# Patient Record
Sex: Female | Born: 1975 | Race: White | Hispanic: No | Marital: Single | Smoking: Never smoker
Health system: Southern US, Community
[De-identification: ages and names within clinical notes are randomized; demographics above are authoritative.]

## PROBLEM LIST (undated history)

## (undated) DIAGNOSIS — R040 Epistaxis: Secondary | ICD-10-CM

## (undated) DIAGNOSIS — R002 Palpitations: Secondary | ICD-10-CM

## (undated) DIAGNOSIS — D369 Benign neoplasm, unspecified site: Secondary | ICD-10-CM

## (undated) DIAGNOSIS — I340 Nonrheumatic mitral (valve) insufficiency: Secondary | ICD-10-CM

## (undated) DIAGNOSIS — L253 Unspecified contact dermatitis due to other chemical products: Secondary | ICD-10-CM

## (undated) HISTORY — PX: WISDOM TOOTH EXTRACTION: SHX21

## (undated) HISTORY — DX: Benign neoplasm, unspecified site: D36.9

## (undated) HISTORY — DX: Unspecified contact dermatitis due to other chemical products: L25.3

## (undated) HISTORY — DX: Nonrheumatic mitral (valve) insufficiency: I34.0

## (undated) HISTORY — DX: Palpitations: R00.2

## (undated) HISTORY — DX: Epistaxis: R04.0

## (undated) HISTORY — PX: OTHER SURGICAL HISTORY: SHX169

---

## 2014-06-10 ENCOUNTER — Ambulatory Visit (INDEPENDENT_AMBULATORY_CARE_PROVIDER_SITE_OTHER): Payer: BC Managed Care – PPO

## 2014-06-10 ENCOUNTER — Ambulatory Visit (INDEPENDENT_AMBULATORY_CARE_PROVIDER_SITE_OTHER): Payer: BC Managed Care – PPO | Admitting: Emergency Medicine

## 2014-06-10 VITALS — BP 128/84 | HR 99 | Temp 97.9°F | Resp 16 | Ht 69.0 in | Wt 173.0 lb

## 2014-06-10 DIAGNOSIS — J3089 Other allergic rhinitis: Secondary | ICD-10-CM

## 2014-06-10 DIAGNOSIS — R0989 Other specified symptoms and signs involving the circulatory and respiratory systems: Secondary | ICD-10-CM

## 2014-06-10 DIAGNOSIS — J029 Acute pharyngitis, unspecified: Secondary | ICD-10-CM

## 2014-06-10 DIAGNOSIS — R0981 Nasal congestion: Secondary | ICD-10-CM

## 2014-06-10 DIAGNOSIS — J3489 Other specified disorders of nose and nasal sinuses: Secondary | ICD-10-CM

## 2014-06-10 LAB — POCT RAPID STREP A (OFFICE): RAPID STREP A SCREEN: NEGATIVE

## 2014-06-10 MED ORDER — FLUTICASONE PROPIONATE 50 MCG/ACT NA SUSP: 2.0000 | Freq: Every day | NASAL | Status: DC

## 2014-06-10 MED ORDER — PROPRANOLOL HCL 10 MG PO TABS: ORAL_TABLET | ORAL | Status: DC

## 2014-06-10 MED ORDER — MONTELUKAST SODIUM 10 MG PO TABS: 10.0000 mg | ORAL_TABLET | Freq: Every day | ORAL | Status: DC

## 2014-06-10 NOTE — Patient Instructions (Signed)

## 2014-06-10 NOTE — Progress Notes (Signed)
   Subjective:    Patient ID: Erin Klein, female    DOB: 1976/04/04, 38 y.o.   MRN: [id number]  HPI patient states that 2 weeks ago she was exposed to mold and mildew at work. Her symptoms resolved after a few days. On Monday she moved the equipment in our office. There is a lot of dust and mold over her papers. After this she developed significant nasal congestion a nonproductive cough and puffiness in her sinus area. She has no previous history of severe allergies     Review of Systems     Objective:   Physical Exam patient is alert and cooperative she is not in any distress. Her TMs were clear. Nose shows turbinates to be blue and swollen. Throat is not red. Neck supple without adenopathy. Chest clear to both auscultation and percussion. Heart regular rate no murmur  Peak Flow 400  Results for orders placed in visit on 06/10/14  POCT RAPID STREP A (OFFICE)      Result Value Ref Range   Rapid Strep A Screen Negative  Negative   [facility] reading (PRIMARY) by  Dr. Everlene Farrier  one view chest shows no acute disease     Assessment & Plan:  She will take Zyrtec one a day use Flonase once a day and have the Singulair at night . She can also use Benadryl as needed

## 2015-11-12 ENCOUNTER — Other Ambulatory Visit: Payer: Self-pay | Admitting: Emergency Medicine

## 2015-12-22 ENCOUNTER — Encounter: Payer: Self-pay | Admitting: Cardiology

## 2015-12-22 ENCOUNTER — Ambulatory Visit (INDEPENDENT_AMBULATORY_CARE_PROVIDER_SITE_OTHER): Payer: BC Managed Care – PPO | Admitting: Cardiology

## 2015-12-22 VITALS — BP 130/78 | HR 99 | Ht 69.0 in | Wt 176.0 lb

## 2015-12-22 DIAGNOSIS — I340 Nonrheumatic mitral (valve) insufficiency: Secondary | ICD-10-CM | POA: Diagnosis not present

## 2015-12-22 DIAGNOSIS — R002 Palpitations: Secondary | ICD-10-CM

## 2015-12-22 DIAGNOSIS — Z136 Encounter for screening for cardiovascular disorders: Secondary | ICD-10-CM | POA: Diagnosis not present

## 2015-12-22 NOTE — Progress Notes (Signed)
Cardiology Office Note  Date: 12/22/2015   ID: Erin Klein, DOB Aug 31, 1976, MRN [id number]  PCP: Vevelyn Pat, MD  Consulting Cardiologist: Rozann Lesches, MD   Chief Complaint  Patient presents with  . History of mitral regurgitation    History of Present Illness: Erin Klein is a 40 y.o. female presenting for cardiac evaluation. She reports a history of mitral regurgitation and also palpitations. I reviewed the available records. She previously followed with Dr. Ledora Bottcher with [facility] in [place], [place]. I summarized her cardiac testing below. She does not have mitral valve prolapse, described to have mild thickening of the mitral valve with only mild mitral regurgitation which is unlikely to be symptomatic. Prior cardiac monitoring did demonstrate rare PVCs, but no other arrhythmias. She states that her palpitations are most bothersome related to emotional stress. She works as a Teaching laboratory technician. She has used propranolol for stressful situations with benefit.  ECG today shows sinus rhythm with decreased R wave progression and nonspecific T-wave changes.  Her last echocardiogram was in 2014.  She does not report sudden dizziness or syncope, no exertional chest pain. She has gained about 20-25 pounds over time and has noticed a reduction in her stamina with this. We did talk about diet and exercise today.  Past Medical History  Diagnosis Date  . Mitral regurgitation     Mild  . Palpitations     Rare PVCs by cardiac monitor  . Dermoid cyst     Removed    Past Surgical History  Procedure Laterality Date  . Dermoid cyst resection      Current Outpatient Prescriptions  Medication Sig Dispense Refill  . propranolol (INDERAL) 10 MG tablet Take one pill daily as needed 90 tablet 1   No current facility-administered medications for this visit.   Allergies:  Review of patient's allergies indicates no known allergies.     Social History: The patient  reports that she has never smoked. She does not have any smokeless tobacco history on file. She reports that she drinks alcohol. She reports that she does not use illicit drugs.   Family History: The patient's family history includes Hypertension in her brother, father, and mother.   ROS:  Please see the history of present illness. Otherwise, complete review of systems is positive for typically NYHA class II dyspnea.  All other systems are reviewed and negative.   Physical Exam: VS:  BP 130/78 mmHg  Pulse 99  Ht 5\' 9"  (1.753 m)  Wt 176 lb (79.833 kg)  BMI 25.98 kg/m2  SpO2 97%, BMI Body mass index is 25.98 kg/(m^2).  Wt Readings from Last 3 Encounters:  12/22/15 176 lb (79.833 kg)  06/10/14 173 lb (78.472 kg)    General: Tall statured woman, appears comfortable at rest. HEENT: Conjunctiva and lids normal, oropharynx clear. Neck: Supple, no elevated JVP or carotid bruits, no thyromegaly. Lungs: Clear to auscultation, nonlabored breathing at rest. Cardiac: Regular rate and rhythm, no S3, faint apical systolic murmur, no click, no pericardial rub. Abdomen: Soft, nontender, bowel sounds present, no guarding or rebound. Extremities: No pitting edema, distal pulses 2+. Skin: Warm and dry. Musculoskeletal: No kyphosis. Neuropsychiatric: Alert and oriented x3, affect grossly appropriate.  ECG: I personally reviewed the prior tracing from 04/08/2015 which showed normal sinus rhythm with decreased R wave progression.  Recent Labwork: May 2016: TSH 3.68 June 2016: Hemoglobin 13.7, platelets 372, BUN 14, creatinine 0.8, potassium 4.3, AST 15, ALT 8, cholesterol 212,  triglycerides 76, HDL 66, LDL 131, TSH 6.61  Other Studies Reviewed Today:  GXT 08/12/2009 ([place], [place]): No chest pain reported, 12.8 METs achieved, 85% MPHR, no diagnostic ST segment abnormalities, normal blood pressure response.  Echocardiogram 04/10/2013 [place], [place] Normal LV EF (74%), normal left atrial size, mildly thickened mitral leaflets with mild mitral regurgitation.  Holter monitor 08/05/2009: Sinus rhythm with PVCs noted, less than 1% total beats, no sustained arrhythmias.  Assessment and Plan:  1. History of mild mitral regurgitation with mild valve thickening but no definite prolapse by prior echocardiography. Her last study was in 2014. She has a faint apical systolic murmur on examination but no click. We will plan a follow-up echocardiogram to reestablish baseline and determine surveillance from there.  2. History of palpitations, mostly associated with emotional stress and anxiety. She uses propranolol intermittently. No definite arrhythmias documented other than rare PVCs by prior cardiac monitoring.  Current medicines were reviewed with the patient today.   Orders Placed This Encounter  Procedures  . EKG 12-Lead  . Echocardiogram    Disposition: FU with me in 1 year.   Signed, Satira Sark, MD, [facility] 12/22/2015 2:41 PM    [place] [facility] [address], [place], [place] [address] Phone: [phone]; Fax: [phone]

## 2015-12-22 NOTE — Patient Instructions (Addendum)
Medication Instructions:  Your physician recommends that you continue on your current medications as directed. Please refer to the Current Medication list given to you today.   Labwork: none  Testing/Procedures: Your physician has requested that you have an echocardiogram. Echocardiography is a painless test that uses sound waves to create images of your heart. It provides your doctor with information about the size and shape of your heart and how well your heart's chambers and valves are working. This procedure takes approximately one hour. There are no restrictions for this procedure.    Follow-Up: Your physician wants you to follow-up in: 1 year.  You will receive a reminder letter in the mail two months in advance. If you don't receive a letter, please call our office to schedule the follow-up appointment.   Any Other Special Instructions Will Be Listed Below (If Applicable).  Thanks for choosing [facility]!!!     If you need a refill on your cardiac medications before your next appointment, please call your pharmacy.

## 2015-12-24 ENCOUNTER — Ambulatory Visit (HOSPITAL_COMMUNITY)
Admission: RE | Admit: 2015-12-24 | Discharge: 2015-12-24 | Disposition: A | Discharge status: Home / Self Care | Payer: BC Managed Care – PPO | Source: Ambulatory Visit | Attending: Cardiology | Admitting: Cardiology

## 2015-12-24 DIAGNOSIS — I071 Rheumatic tricuspid insufficiency: Secondary | ICD-10-CM | POA: Insufficient documentation

## 2015-12-24 DIAGNOSIS — I340 Nonrheumatic mitral (valve) insufficiency: Secondary | ICD-10-CM

## 2016-08-02 ENCOUNTER — Telehealth: Payer: Self-pay | Admitting: Cardiology

## 2016-08-02 MED ORDER — PROPRANOLOL HCL 10 MG PO TABS: ORAL_TABLET | ORAL | 1 refills | Status: DC

## 2016-08-02 NOTE — Telephone Encounter (Deleted)
Pt needs Rx to go to [facility] on [address].

## 2016-08-02 NOTE — Telephone Encounter (Addendum)
1. Which medications need to be refilled? (please list name of each medication and dose if known)   propranolol (INDERAL) 10 MG tablet [id number]    2. Which pharmacy/location (including street and city if local pharmacy) is medication to be sent to?  [place]   3. Do they need a 30 day or 90 day supply?

## 2016-12-19 ENCOUNTER — Telehealth: Payer: Self-pay | Admitting: Cardiology

## 2016-12-19 NOTE — Telephone Encounter (Signed)
That is OK

## 2016-12-19 NOTE — Telephone Encounter (Signed)
New Message    Pt is requesting a switch in cardiologist from [place] to  Dr Dorris Carnes.

## 2016-12-19 NOTE — Telephone Encounter (Signed)
It is okay with me if she would like to make that switch. I saw her one year ago.

## 2016-12-21 NOTE — Progress Notes (Signed)
Cardiology Office Note   Date:  12/22/2016   ID:  Erin Klein, DOB Dec 29, 1975, MRN [id number]  PCP:  Vevelyn Pat, MD  Cardiologist:   Dorris Carnes, MD    Pt presents for continued care of mitral regurg and palpitations    History of Present Illness: Erin Klein is a 41 y.o. female with a history of MR and palpitations  Previously followed by [facility] (seen once in March 2017)  PRior to that seen in [facility] Does not have MVP on echo  Did have PVCs  Has Rx with propranolol   Trivial MR on echo in march Past couple months has had some chest pressure  Laying or sitting  Before bed  Will get reflux  Buring  Not with activity     Just got a boflex trainer   Takes Inderal when faced to increased stress     No outpatient prescriptions have been marked as taking for the 12/22/16 encounter (Office Visit) with Erin Records, MD.     Allergies:   Patient has no known allergies.   Past Medical History:  Diagnosis Date  . Dermoid cyst    Removed  . Mitral regurgitation    Mild  . Palpitations    Rare PVCs by cardiac monitor    Past Surgical History:  Procedure Laterality Date  . Dermoid cyst resection       Social History:  The patient  reports that she has never smoked. She has never used smokeless tobacco. She reports that she drinks alcohol. She reports that she does not use drugs.   Family History:  The patient's family history includes Hypertension in her brother, father, and mother.    ROS:  Please see the history of present illness. All other systems are reviewed and  Negative to the above problem except as noted.    PHYSICAL EXAM: VS:  BP 122/84   Pulse 89   Ht 5\' 9"  (1.753 m)   Wt 187 lb (84.8 kg)   BMI 27.62 kg/m   GEN: Well nourished, well developed, in no acute distress  HEENT: normal  Neck: no JVD, carotid bruits, or masses Cardiac: RRR; no murmurs, rubs, or gallops,no edema  Respiratory:  clear to auscultation bilaterally, normal  work of breathing GI: soft, nontender, nondistended, + BS  No hepatomegaly  MS: no deformity Moving all extremities   Skin: warm and dry, no rash Neuro:  Strength and sensation are intact Psych: euthymic mood, full affect   EKG:  EKG is ordered today.SR 89 bpm     Lipid Panel No results found for: CHOL, TRIG, HDL, CHOLHDL, VLDL, LDLCALC, LDLDIRECT    Wt Readings from Last 3 Encounters:  12/22/16 187 lb (84.8 kg)  12/22/15 176 lb (79.8 kg)  06/10/14 173 lb (78.5 kg)      ASSESSMENT AND PLAN:  1  Palpitations  Denies   Keep on INderal  2  Mitral regurg  Trivial on echo  No Murmur on exam  3  Chest pressure/discomfort  Sounds more GI  Not cardiac  4  HCM  Pt will have lipids sent  Discussed diet and exercise  f/U in 1 year      Current medicines are reviewed at length with the patient today.  The patient does not have concerns regarding medicines.  Signed, Dorris Carnes, MD  12/22/2016 8:51 AM    [address] [place] [place], [place], [place]  [address] Phone: [phone]; Fax: [phone]    

## 2016-12-22 ENCOUNTER — Ambulatory Visit (INDEPENDENT_AMBULATORY_CARE_PROVIDER_SITE_OTHER): Payer: BC Managed Care – PPO | Admitting: Internal Medicine

## 2016-12-22 ENCOUNTER — Encounter: Payer: Self-pay | Admitting: Internal Medicine

## 2016-12-22 ENCOUNTER — Encounter (INDEPENDENT_AMBULATORY_CARE_PROVIDER_SITE_OTHER): Payer: Self-pay

## 2016-12-22 VITALS — BP 122/84 | HR 89 | Ht 69.0 in | Wt 187.0 lb

## 2016-12-22 DIAGNOSIS — R002 Palpitations: Secondary | ICD-10-CM

## 2016-12-22 NOTE — Patient Instructions (Signed)
Your physician wants you to follow-up in: [place] will receive a reminder letter in the mail two months in advance. If you don't receive a letter, please call our office to schedule the follow-up appointment.   If you need a refill on your cardiac medications before your next appointment, please call your pharmacy.

## 2017-07-06 ENCOUNTER — Ambulatory Visit: Payer: BC Managed Care – PPO | Admitting: Allergy

## 2017-07-13 ENCOUNTER — Encounter: Payer: Self-pay | Admitting: Allergy

## 2017-07-13 ENCOUNTER — Ambulatory Visit (INDEPENDENT_AMBULATORY_CARE_PROVIDER_SITE_OTHER): Payer: BC Managed Care – PPO | Admitting: Allergy

## 2017-07-13 VITALS — BP 118/78 | HR 81 | Temp 98.0°F | Resp 18 | Ht 68.0 in | Wt 186.0 lb

## 2017-07-13 DIAGNOSIS — R040 Epistaxis: Secondary | ICD-10-CM

## 2017-07-13 DIAGNOSIS — L500 Allergic urticaria: Secondary | ICD-10-CM | POA: Diagnosis not present

## 2017-07-13 DIAGNOSIS — J301 Allergic rhinitis due to pollen: Secondary | ICD-10-CM

## 2017-07-13 NOTE — Patient Instructions (Addendum)
Hives    - your rash appears consistent with hives and there are no concerning features at this time.  Hives can be caused by variety of different triggers including foods, medications, stings, topical products, illnesses, stress, pressure, temperature changes, vibrations and  exercise to name a few triggers.      - skin testing for environmental allergens and foods today are positive to grasses, weeds, trees, molds, dust mites, cat and cockroach.  Allergen avoidance measures discussed and handouts provided.  Foods were all negative with equivocal result to rye.     - recommend use of Zyrtec 10mg  daily.  May increase to twice a day dosing if hives persists.  Also recommend use of Zantac 150mg  or Pepcid 20mg  in addition to zyrtec if hives are not well managed with Zyrtec alone.    - let us know if hives start to leave any marks/bruising, you have fevers with hives, you have joint aches/pains with hives   - you may have a component of contact dermatitis as well which can be addressed by performing patch testing and also recommend bringing samples of the topical creams you have used in past.     Allergic rhinitis and nosebleeds   - testing as above   - Zyrtec as above   - nasal saline spray to help decrease nosebleeds and moisturize nose  Food avoidance   - will obtain tree nut panel, fish and shellfish panel as well as rye.  This will complete food testing and determine if you can introduce these foods into the diet.   Follow-up 4 months or sooner if needed.  Schedule for patch testing on a Monday.

## 2017-07-13 NOTE — Progress Notes (Signed)
New Patient Note  RE: Erin Klein MRN: [id number] DOB: Dec 05, 1975 Date of Office Visit: 07/13/2017  Referring provider: Hulan Amato* Primary care provider: Sheets, Marella Bile, MD    Chief Complaint: allergic reaction  History of present illness: Erin Klein is a 41 y.o. female presenting today for consultation for allergic reaction.   She rpeorts having an reaction after lunch at a [employer] in June.  She had been to this restaurant before without issue but states she did eat a new dish that had gaucomole but she does not remember what she ate except that the dish was spicy.  Within minutes after finishing lunch she states a welt-like itchy rash developed across her chin and jaw with swelling.   She took benadryl that did help.  But states that the rash was present throughout that next week. Since this she has noticed outbreaks of hives on her face and upper chest occurring weekly.   She reports rash comes and goes in 24hr but can last for days at a time.  The hives do not leave any marks/bruising, no fevers, no joint aches/pain.  She denies any preceding illnesses, no new medications, no stings.  She reports use non dye detergent and soaps.  She uses no make-up.  She does recall using a topical cream containing sulfur for acne management the week she developed the hives.  She got this cream off [place] and threw the tube away.    Around July she started getting nose bleeds that was occurring almost weekly.  The nose bleeds are light and usually stop within minutes.  She went to UC due to multiple bloody nose in one day as well as feeling dizzy.   She was told that it could be related to allergies or sinus infection.   She was given prescription for an antibiotic.    She does report a significant nut and seafood allergy in sister.  Several nephews with peanut allergy.   She had been avoiding tree nuts and shellfish for many years due to sister and would like to  be able to eat them.  However she is nervous she might also be allergic but has never had any testing.  Strawberry and tomato makes her tongue feel tingly.   No history asthma or eczema.    Review of systems: Review of Systems  Constitutional: Negative for chills, fever and malaise/fatigue.  HENT: Positive for nosebleeds. Negative for congestion, ear discharge, ear pain, sinus pain, sore throat and tinnitus.   Eyes: Negative for discharge and redness.  Respiratory: Negative for cough, sputum production, shortness of breath and wheezing.   Cardiovascular: Negative for chest pain.  Gastrointestinal: Negative for abdominal pain, constipation, diarrhea, heartburn, nausea and vomiting.  Musculoskeletal: Negative for joint pain.  Skin: Positive for itching and rash.  Neurological: Negative for headaches.    All other systems negative unless noted above in HPI  Past medical history: Past Medical History:  Diagnosis Date  . Dermoid cyst    Removed  . Mitral regurgitation    Mild  . Palpitations    Rare PVCs by cardiac monitor    Past surgical history: Past Surgical History:  Procedure Laterality Date  . Dermoid cyst resection    . WISDOM TOOTH EXTRACTION      Family history:  Family History  Problem Relation Age of Onset  . Hypertension Mother   . Hypertension Father   . Hypertension Brother   see HPI   Social history:  Lives in a home with carpeting with central cooling.  There are 3 dogs in the home.  There is concern for water damage or mildew.  No concern for roaches in the home.  She is a Teaching laboratory technician.  She has no smoking history.    Medication List: Allergies as of 07/13/2017   No Known Allergies     Medication List       Accurate as of 07/13/17  5:43 PM. Always use your most recent med list.          escitalopram 10 MG tablet Commonly known as:  LEXAPRO TK 1 T PO QD   propranolol 10 MG tablet Commonly known as:  INDERAL Take one pill daily as  needed       Known medication allergies: No Known Allergies   Physical examination: Blood pressure 118/78, pulse 81, temperature 98 F (36.7 C), resp. rate 18, height 5\' 8"  (1.727 m), weight 186 lb (84.4 kg), SpO2 98 %.  General: Alert, interactive, in no acute distress. HEENT: PERRLA, TMs pearly gray, turbinates non-edematous without discharge, post-pharynx non erythematous. Neck: Supple without lymphadenopathy. Lungs: Clear to auscultation without wheezing, rhonchi or rales. {no increased work of breathing. CV: Normal S1, S2 without murmurs. Abdomen: Nondistended, nontender. Skin: mild erythema with telangectasia of cheeks b/l.  fleeting erythematous patches over upper chest and neck. Extremities:  No clubbing, cyanosis or edema. Neuro:   Grossly intact.  Diagnositics/Labs:  Allergy testing: environmental allergy skin prick testing is positive for grasses, weeds, trees, molds, dust mites, cat and cockroach Select food skin prick testing is equivocal to rye.  Negative to peanut, wheat, milk, shellfish mix, fish mix, cashew, tomato, pork, [place], beef, chicken, banana, strawberry, flounder, trout, shrimp, crab, tuna, pecan, walnut, cucumber, oyster, lobster, scallops, salmon, almond, hazelnut, [place] nut, oat Allergy testing results were read and interpreted by provider, documented by clinical staff.   Assessment and plan:   Hives, urticaria (chronic)    - your rash appears consistent with hives and there are no concerning features at this time.  Hives can be caused by variety of different triggers including foods, medications, stings, topical products, illnesses, stress, pressure, temperature changes, vibrations and  exercise to name a few triggers.  It is less likely related to the [place] meal she ate as she has continued to have issues with hives since that time.      - skin testing for environmental allergens and foods today are positive to grasses, weeds, trees, molds, dust  mites, cat and cockroach.  Allergen avoidance measures discussed and handouts provided.  Foods were all negative with equivocal result to rye.     - recommend use of Zyrtec 10mg  daily.  May increase to twice a day dosing if hives persists.  Also recommend use of Zantac 150mg  or Pepcid 20mg  in addition to zyrtec if hives are not well managed with Zyrtec alone.    - let us know if hives start to leave any marks/bruising, you have fevers with hives, you have joint aches/pains with hives   - you may have a component of contact dermatitis as well which can be addressed by performing patch testing and also recommend bringing samples of the topical creams you have used in past for use in patch testing wells.  Allergic rhinitis and nosebleeds   - testing as above   - Zyrtec as above   - nasal saline spray to help decrease nosebleeds and moisturize nose  Food avoidance   - will  obtain tree nut panel, fish and shellfish panel as well as rye.  This will complete food testing and determine if you can introduce these foods into the diet or if in-office challenge would be recommended.   Follow-up 4 months or sooner if needed.  Schedule for patch testing on a Monday.    I appreciate the opportunity to take part in [facility] care. Please do not hesitate to contact me with questions.  Sincerely,   Prudy Feeler, MD Allergy/Immunology [facility]

## 2017-07-18 LAB — ALLERGEN PROFILE, FOOD-FISH
Allergen Walley Pike IgE: <0.1 kU/L
Codfish IgE: <0.1 kU/L
Tuna: <0.1 kU/L

## 2017-07-18 LAB — ALLERGENS(7)
Brazil Nut IgE: 0.1 kU/L — AB
F020-IgE Almond: 0.11 kU/L — AB
F202-IgE Cashew Nut: <0.1 kU/L
Hazelnut (Filbert) IgE: 0.15 kU/L — AB
PEANUT IGE: 0.22 kU/L — AB
Walnut IgE: 0.27 kU/L — AB

## 2017-07-18 LAB — ALLERGEN PROFILE, SHELLFISH
Clam IgE: <0.1 kU/L
F023-IgE Crab: <0.1 kU/L
F080-IgE Lobster: <0.1 kU/L

## 2017-07-18 LAB — ALLERGEN, RYE, F5: Rye IgE: 0.33 kU/L — AB

## 2017-09-10 ENCOUNTER — Encounter: Payer: Self-pay | Admitting: Allergy & Immunology

## 2017-09-10 ENCOUNTER — Encounter: Payer: BC Managed Care – PPO | Admitting: Allergy & Immunology

## 2017-09-10 ENCOUNTER — Ambulatory Visit: Payer: BC Managed Care – PPO | Admitting: Allergy & Immunology

## 2017-09-10 VITALS — BP 116/78 | HR 80 | Ht 69.75 in | Wt 190.0 lb

## 2017-09-10 DIAGNOSIS — L253 Unspecified contact dermatitis due to other chemical products: Secondary | ICD-10-CM

## 2017-09-10 DIAGNOSIS — L500 Allergic urticaria: Secondary | ICD-10-CM

## 2017-09-10 NOTE — Patient Instructions (Addendum)
1. Allergic urticaria versus contact dermatitis - Patches placed today. - Come back on Wednesday and Friday for readings.  - Try using nasal saline gel 1-2 times daily to see if this helps the nose bleeds.   2. Return in about 2 days (around 09/12/2017).  Please inform us of any Emergency Department visits, hospitalizations, or changes in symptoms. Call us before going to the ED for breathing or allergy symptoms since we might be able to fit you in for a sick visit. Feel free to contact us anytime with any questions, problems, or concerns.  It was a pleasure to meet you today! Enjoy the holiday season!  Websites that have reliable patient information: 1. American Academy of Asthma, Allergy, and Immunology: www.aaaai.org 2. Food Allergy Research and Education (FARE): foodallergy.org 3. Mothers of Asthmatics: http://www.asthmacommunitynetwork.org 4. American College of Allergy, Asthma, and Immunology: www.acaai.org

## 2017-09-10 NOTE — Progress Notes (Signed)
    Follow-up Note  RE: Erin Klein MRN: [id number] DOB: [birth year] Date of Office Visit: 09/10/2017  Primary care provider: Vevelyn Pat, MD Referring provider: Hulan Amato*   [name] comes to the office today for the initial patch test interpretation, given suspected history of contact dermatitis. She did bring in some lotions and make-ups. She did not bring in any shampoo at all. She does report continued epistaxis today. She has been on the Flonase with some improvement in rhinitis symptoms, but no change in the epistaxis symptoms. These do not require ED visits or any intervention. She does not use nasal saline rinses routinely.    Diagnostics: patches placed today   Plan:   Allergic contact dermatitis - True Test patches placed. - We also placed samples of topical cosmetics in empty chambers.  - She will come back on Wednesday and Friday for reads.    Salvatore Marvel, MD [facility]

## 2017-09-12 ENCOUNTER — Encounter: Payer: Self-pay | Admitting: Family Medicine

## 2017-09-12 ENCOUNTER — Ambulatory Visit: Payer: BC Managed Care – PPO | Admitting: Family Medicine

## 2017-09-12 DIAGNOSIS — L253 Unspecified contact dermatitis due to other chemical products: Secondary | ICD-10-CM | POA: Insufficient documentation

## 2017-09-12 DIAGNOSIS — R040 Epistaxis: Secondary | ICD-10-CM

## 2017-09-12 HISTORY — DX: Unspecified contact dermatitis due to other chemical products: L25.3

## 2017-09-12 HISTORY — DX: Epistaxis: R04.0

## 2017-09-12 NOTE — Progress Notes (Signed)
   [facility] [id number] Dept: [phone]  FAMILY NURSE PRACTITIONER FOLLOW UP NOTE  RE: Erin Klein MRN: [id number] DOB: 08/01/1976 Date of Office Visit: 09/12/2017  Primary care provider: Vevelyn Pat, MD Referring provider: Hulan Amato*   Erin Klein returns to the office today for the final patch test interpretation, given suspected history of contact dermatitis.    Diagnostics:   TRUE TEST 48-hour hour reading: probable reaction to #4 (Potassium dichromate), possible reaction to #27 (Tixocortol-21-pivalate) and possible reaction to #28 (Gold sodium thiosulfate), possible reaction to #6 on the patch containing products from home (acnomel).  Plan:   Allergic contact dermatitis - The patient has been provided detailed information regarding the substances she is sensitive to, as well as products containing the substances.   - Meticulous avoidance of these substances is recommended.  - If avoidance is not possible, the use of barrier creams or lotions is recommended. - If symptoms persist or progress despite meticulous avoidance of potassium dichromate, Tixocortol 21-pivilate, gold sodium thiosulfate, and acnomel, Dermatology Referral may be warranted.   Drug Allergies:  No Known Allergies    Assessment and Plan: 1. Contact dermatitis due to chemicals   2. Epistaxis     Patient Instructions  Contact dermatitis Your 48 hour patch reading showed a probable reaction to #4 (Potassium dichromate), possible reaction to #27 (Tixocortol-21-pivalate) and possible reaction to #28 (Gold sodium thiosulfate), possible reaction to #6 on the patch containing products from home (acnomel).-   You have been provided detailed information regarding the substances you may be sensitive to, as well as products containing the substances.   - Meticulous avoidance of these substances is recommended.  - If avoidance is not possible, the use of barrier  creams or lotions is recommended. - If symptoms persist or progress despite meticulous avoidance of potassium dichromate, Tixocortol 21-pivilate, gold sodium thiosulfate, and acnomel, Dermatology Referral may be warranted.   Return in about 2 days (around 09/14/2017), or if symptoms worsen or fail to improve, for Final patch reading.    Thank you for the opportunity to care for this patient.  Please do not hesitate to contact me with questions.  Gareth Morgan, FNP [facility] [place] [facility]  I have provided oversight concerning Gareth Morgan' evaluation and treatment of this patient's health issues addressed during today's encounter. I agree with the assessment and therapeutic plan as outlined in the note.   Signed,   Jiles Prows, MD,  Allergy and Immunology,  [facility].

## 2017-09-12 NOTE — Patient Instructions (Addendum)
Contact dermatitis Your 48 hour patch reading showed a probable reaction to #4 (Potassium dichromate), possible reaction to #27 (Tixocortol-21-pivalate) and possible reaction to #28 (Gold sodium thiosulfate), possible reaction to #6 on the patch containing products from home (acnomel).-   You have been provided detailed information regarding the substances you may be sensitive to, as well as products containing the substances.   - Meticulous avoidance of these substances is recommended.  - If avoidance is not possible, the use of barrier creams or lotions is recommended. - If symptoms persist or progress despite meticulous avoidance of potassium dichromate, Tixocortol 21-pivilate, gold sodium thiosulfate, and acnomel, Dermatology Referral may be warranted.

## 2017-09-14 ENCOUNTER — Ambulatory Visit (INDEPENDENT_AMBULATORY_CARE_PROVIDER_SITE_OTHER): Payer: BC Managed Care – PPO | Admitting: Allergy

## 2017-09-14 DIAGNOSIS — L253 Unspecified contact dermatitis due to other chemical products: Secondary | ICD-10-CM

## 2017-09-14 NOTE — Progress Notes (Signed)
    Follow-up Note  RE: Erin Klein MRN: [id number] DOB: [birth year] Date of Office Visit: 09/14/2017  Primary care provider: Vevelyn Pat, MD Referring provider: Hulan Amato*   [name] returns to the office today for the final patch test interpretation, given suspected history of contact dermatitis.    Diagnostics:  TRUE TEST 96 hour reading:  #4 (Potassium dichromate) with erythematous papules.  #11 (ethylenediamine dihydrochloride), #12 (cobalt dichloride), #27  (Tixocortol-21-pivalate) and #28(Gold sodium thiosulfate) all with faint erythema.  #6 Acnomel from home products with faint erythema  48-hour hour reading: probable reaction to #4 (Potassium dichromate), possible reaction to #27 (Tixocortol-21-pivalate) and possible reaction to #28 (Gold sodium thiosulfate), possible reaction to #6 on the patch containing products from home (acnomel).  Plan:  Allergic contact dermatitis  The patient has been provided detailed information regarding the substances she is sensitive to, as well as products containing the substances.  Meticulous avoidance of these substances is recommended. If avoidance is not possible, the use of barrier creams or lotions is recommended. If symptoms persist or progress despite meticulous avoidance of above products, dermatology evaluation may be warranted.

## 2017-10-11 ENCOUNTER — Telehealth: Payer: Self-pay | Admitting: Family Medicine

## 2017-10-11 NOTE — Telephone Encounter (Signed)
I left a message for her to call me with any further questions  regarding the area that remains darkened on her skin after patch testing.

## 2017-10-18 ENCOUNTER — Telehealth: Payer: Self-pay | Admitting: Family Medicine

## 2017-10-18 NOTE — Telephone Encounter (Signed)
Thank you. I called her back and she is going to try a dye-free skin cream until Wednesday. If no better she will come in on Thursday.

## 2017-10-18 NOTE — Telephone Encounter (Signed)
Erin Klein,  Erin Klein would like you to call her regarding a patch test.  She called [facility] office and was transferred here since you were working here today.  Thank you Andee Poles

## 2017-11-05 ENCOUNTER — Ambulatory Visit (INDEPENDENT_AMBULATORY_CARE_PROVIDER_SITE_OTHER): Payer: BC Managed Care – PPO | Admitting: Otolaryngology

## 2017-11-05 DIAGNOSIS — R040 Epistaxis: Secondary | ICD-10-CM

## 2018-01-21 ENCOUNTER — Other Ambulatory Visit: Payer: Self-pay | Admitting: *Deleted

## 2018-01-21 MED ORDER — PROPRANOLOL HCL 10 MG PO TABS: ORAL_TABLET | ORAL | 0 refills | Status: DC

## 2018-02-04 ENCOUNTER — Ambulatory Visit (INDEPENDENT_AMBULATORY_CARE_PROVIDER_SITE_OTHER): Payer: BC Managed Care – PPO | Admitting: Otolaryngology

## 2018-02-04 DIAGNOSIS — R040 Epistaxis: Secondary | ICD-10-CM | POA: Diagnosis not present

## 2018-05-13 ENCOUNTER — Ambulatory Visit (INDEPENDENT_AMBULATORY_CARE_PROVIDER_SITE_OTHER): Payer: BC Managed Care – PPO | Admitting: Internal Medicine

## 2018-05-13 ENCOUNTER — Encounter: Payer: Self-pay | Admitting: Internal Medicine

## 2018-05-13 VITALS — BP 130/70 | HR 89 | Ht 69.5 in | Wt 187.0 lb

## 2018-05-13 DIAGNOSIS — R040 Epistaxis: Secondary | ICD-10-CM

## 2018-05-13 DIAGNOSIS — I340 Nonrheumatic mitral (valve) insufficiency: Secondary | ICD-10-CM

## 2018-05-13 DIAGNOSIS — R0789 Other chest pain: Secondary | ICD-10-CM

## 2018-05-13 DIAGNOSIS — R002 Palpitations: Secondary | ICD-10-CM | POA: Diagnosis not present

## 2018-05-13 NOTE — Progress Notes (Signed)
Cardiology Office Note   Date:  05/13/2018   ID:  Erin Klein, DOB 12-28-1975, MRN [id number]  PCP:  Charisse March, MD  Cardiologist:   Dorris Carnes, MD    Pt presents for continued care of mitral regurg and palpitations    History of Present Illness: Erin Klein is a 42 y.o. female with a history of mitral regurg and palpitations  Previously followed by Myles Gip (seen once in March 2017)  PRior to that seen in [facility] Does not have MVP on echo  Did have PVCs  Has Rx with propranolol   Trivial MR on echo in march I last saw her in clinic in 2018 Doing good overall   Has had some nose bleeds since last summer   Has seen an allergist and and ENT   Palpitaions come and go  Rare   Not significant     Starting work again   [employer]   No outpatient medications have been marked as taking for the 05/13/18 encounter (Office Visit) with Fay Records, MD.     Allergies:   Patient has no known allergies.   Past Medical History:  Diagnosis Date  . Contact dermatitis due to chemicals 09/12/2017  . Dermoid cyst    Removed  . Epistaxis 09/12/2017  . Mitral regurgitation    Mild  . Palpitations    Rare PVCs by cardiac monitor    Past Surgical History:  Procedure Laterality Date  . Dermoid cyst resection    . WISDOM TOOTH EXTRACTION       Social History:  The patient  reports that she has never smoked. She has never used smokeless tobacco. She reports that she drinks alcohol. She reports that she does not use drugs.   Family History:  The patient's family history includes Hypertension in her brother, father, and mother.    ROS:  Please see the history of present illness. All other systems are reviewed and  Negative to the above problem except as noted.    PHYSICAL EXAM: VS:  BP 130/70   Pulse 89   Ht 5' 9.5" (1.765 m)   Wt 187 lb (84.8 kg)   BMI 27.22 kg/m   GEN: Well nourished, well developed, in no acute distress  HEENT: normal  Neck: JVP  is normal  No carotid bruits, or masses Cardiac: RRR; no murmurs, rubs, or gallops,no edema  Respiratory:  clear to auscultation bilaterally, normal work of breathing GI: soft, nontender, nondistended, + BS  No hepatomegaly  MS: no deformity Moving all extremities   Skin: warm and dry, no rash Neuro:  Strength and sensation are intact Psych: euthymic mood, full affect   EKG:  EKG is ordered today.SR 89 BPM       Lipid Panel No results found for: CHOL, TRIG, HDL, CHOLHDL, VLDL, LDLCALC, LDLDIRECT    Wt Readings from Last 3 Encounters:  05/13/18 187 lb (84.8 kg)  09/10/17 190 lb (86.2 kg)  07/13/17 186 lb (84.4 kg)      ASSESSMENT AND PLAN:  1  Palpitations  Denies   2  Mitral regurg  Trivial on echo  No Murmur  3  Hx of Chest pressure/discomfort  Denies    4  Nose bleeds   Pt has been seen by allergy and ENT   I recomm Milta Deiters Med wash   F/U with ENT if still bleeding     f/U in 1 year  Or prn    Current medicines are  reviewed at length with the patient today.  The patient does not have concerns regarding medicines.  Signed, Dorris Carnes, MD  05/13/2018 [facility] [place], [place], [place]  [address] Phone: [phone]; Fax: [phone]

## 2018-05-13 NOTE — Patient Instructions (Signed)
Your physician recommends that you continue on your current medications as directed. Please refer to the Current Medication list given to you today. Your physician recommends that you schedule a follow-up appointment as needed with Dr. Harrington Challenger.

## 2019-07-02 ENCOUNTER — Other Ambulatory Visit: Payer: Self-pay | Admitting: *Deleted

## 2019-07-02 DIAGNOSIS — Z20822 Contact with and (suspected) exposure to covid-19: Secondary | ICD-10-CM

## 2019-07-04 LAB — NOVEL CORONAVIRUS, NAA: SARS-CoV-2, NAA: NOT DETECTED

## 2019-08-08 ENCOUNTER — Other Ambulatory Visit: Payer: Self-pay | Admitting: *Deleted

## 2019-08-08 DIAGNOSIS — Z20822 Contact with and (suspected) exposure to covid-19: Secondary | ICD-10-CM

## 2019-08-11 LAB — NOVEL CORONAVIRUS, NAA: SARS-CoV-2, NAA: NOT DETECTED

## 2019-09-05 ENCOUNTER — Other Ambulatory Visit: Payer: Self-pay

## 2019-09-05 DIAGNOSIS — Z20822 Contact with and (suspected) exposure to covid-19: Secondary | ICD-10-CM

## 2019-09-06 LAB — NOVEL CORONAVIRUS, NAA: SARS-CoV-2, NAA: NOT DETECTED

## 2019-09-26 ENCOUNTER — Ambulatory Visit
Admission: EM | Admit: 2019-09-26 | Discharge: 2019-09-26 | Disposition: A | Discharge status: Home / Self Care | Payer: BC Managed Care – PPO | Attending: Emergency Medicine | Admitting: Emergency Medicine

## 2019-09-26 ENCOUNTER — Other Ambulatory Visit: Payer: Self-pay

## 2019-09-26 DIAGNOSIS — R42 Dizziness and giddiness: Secondary | ICD-10-CM | POA: Insufficient documentation

## 2019-09-26 LAB — POCT URINALYSIS DIP (MANUAL ENTRY)
Bilirubin, UA: NEGATIVE
Glucose, UA: NEGATIVE mg/dL
Ketones, POC UA: NEGATIVE mg/dL
Leukocytes, UA: NEGATIVE
Nitrite, UA: NEGATIVE
Protein Ur, POC: NEGATIVE mg/dL
Spec Grav, UA: 1.015 (ref 1.010–1.025)
Urobilinogen, UA: 0.2 E.U./dL
pH, UA: 5.5 (ref 5.0–8.0)

## 2019-09-26 LAB — POCT FASTING CBG KUC MANUAL ENTRY: POCT Glucose (KUC): 99 mg/dL (ref 70–99)

## 2019-09-26 NOTE — ED Provider Notes (Addendum)
[facility]    CSN: [id number] Arrival date & time: 09/26/19  1156      History   Chief Complaint Chief Complaint  Patient presents with  . Dizziness    HPI Erin Klein is a 44 y.o. female.   Erin Klein is a 44 y.o. female who presents with complaint of dizziness that began 3weeks ago.  Denies a precipitating event, trauma, or recent URI within the past month.  Describes the dizziness as  She is fainting.  Has tried  Propanolol without relief.  Symptoms made worse with sitting down.  denies to previous symptoms .  Denies fever, chills, nausea, vomiting, hearing changes, tinnitus, ear pain, chest pain, syncope, SOB, weakness, slurred speech, memory or emotional changes, facial drooping/ asymmetry, incoordination, numbness or tingling, abdominal pain, changes in bowel or bladder habits.    The history is provided by the patient. No language interpreter was used.  Dizziness   Past Medical History:  Diagnosis Date  . Contact dermatitis due to chemicals 09/12/2017  . Dermoid cyst    Removed  . Epistaxis 09/12/2017  . Mitral regurgitation    Mild  . Palpitations    Rare PVCs by cardiac monitor    Patient Active Problem List   Diagnosis Date Noted  . Contact dermatitis due to chemicals 09/12/2017  . Epistaxis 09/12/2017    Past Surgical History:  Procedure Laterality Date  . Dermoid cyst resection    . WISDOM TOOTH EXTRACTION      OB History   No obstetric history on file.      Home Medications    Prior to Admission medications   Medication Sig Start Date End Date Taking? Authorizing Provider  propranolol (INDERAL) 10 MG tablet Take one tab by mouth daily as needed 01/21/18   Fay Records, MD    Family History Family History  Problem Relation Age of Onset  . Hypertension Mother   . Hypertension Father   . Hypertension Brother     Social History Social History   Tobacco Use  . Smoking status: Never Smoker  . Smokeless tobacco: Never  Used  Substance Use Topics  . Alcohol use: Yes    Alcohol/week: 0.0 standard drinks  . Drug use: No     Allergies   Patient has no known allergies.   Review of Systems Review of Systems  Constitutional: Negative.   Respiratory: Negative.   Cardiovascular: Negative.   Neurological: Positive for dizziness.     Physical Exam Triage Vital Signs ED Triage Vitals  Enc Vitals Group     BP 09/26/19 1207 (!) 148/92     Pulse Rate 09/26/19 1207 91     Resp 09/26/19 1207 18     Temp 09/26/19 1207 98.8 F (37.1 C)     Temp Source 09/26/19 1207 Oral     SpO2 09/26/19 1207 97 %     Weight --      Height --      Head Circumference --      Peak Flow --      Pain Score 09/26/19 1214 0     Pain Loc --      Pain Edu? --      Excl. in [place]? --    No data found.  Updated Vital Signs BP (!) 148/92 (BP Location: Right Arm)   Pulse 91   Temp 98.8 F (37.1 C) (Oral)   Resp 18   LMP 09/22/2019   SpO2 97%  Visual Acuity Right Eye Distance:   Left Eye Distance:   Bilateral Distance:    Right Eye Near:   Left Eye Near:    Bilateral Near:     Physical Exam Vitals and nursing note reviewed.  Constitutional:      General: She is not in acute distress.    Appearance: Normal appearance. She is normal weight. She is not ill-appearing.  Cardiovascular:     Rate and Rhythm: Normal rate and regular rhythm.     Pulses: Normal pulses.     Heart sounds: Normal heart sounds.  Pulmonary:     Effort: Pulmonary effort is normal.     Breath sounds: Normal breath sounds. No rhonchi.  Neurological:     Mental Status: She is alert.     Cranial Nerves: Cranial nerves are intact.     Sensory: Sensation is intact.     Motor: Motor function is intact.     Coordination: Coordination is intact.     Gait: Gait is intact.     Deep Tendon Reflexes:     Reflex Scores:      Patellar reflexes are 2+ on the right side and 2+ on the left side.     UC Treatments / Results  Labs (all labs  ordered are listed, but only abnormal results are displayed) Labs Reviewed  POCT URINALYSIS DIP (MANUAL ENTRY) - Abnormal; Notable for the following components:      Result Value   Blood, UA moderate (*)    All other components within normal limits  URINE CULTURE  CBC WITH DIFFERENTIAL/PLATELET  COMPREHENSIVE METABOLIC PANEL  POCT FASTING [facility] [place]    EKG   Radiology No results found.  Procedures Procedures (including critical care time)  Medications Ordered in UC Medications - No data to display  Initial Impression / Assessment and Plan / UC Course  I have reviewed the triage vital signs and the nursing notes.  Pertinent labs & imaging results that were available during my care of the patient were reviewed by me and considered in my medical decision making (see chart for details).    CBG, UA, EKG was ordered and completed in office.  The result was reviewed. CBC, CMP and urine culture was ordered Advised patient to follow-up with cardiologist due to her history of mitral valve regurgitation.  To use propanolol as prescribed.  Advised patient to go to the ED for worsening of her symptoms.  Patient verbalized understanding of the plan of care. Final Clinical Impressions(s) / UC Diagnoses   Final diagnoses:  Dizziness     Discharge Instructions     Someone will call if CBC, CMP, urine culture result is abnormal. Advised patient to follow-up with cardiologist as soon as possible To use propranolol as prescribed To go to ED for worsening of symptoms    ED Prescriptions    None     PDMP not reviewed this encounter.   Emerson Monte, FNP 09/26/19 1348    Emerson Monte, FNP 09/26/19 1348

## 2019-09-26 NOTE — ED Triage Notes (Signed)
Pt presents to UC w/ c/o moments of feeling "an overwhelming rush" and feeling like she was going to pass out x2 weeks. Pt states these episodes last a few seconds at a time and at different times of the day and occur while she's sitting.

## 2019-09-26 NOTE — Discharge Instructions (Addendum)
Someone will call if CBC, CMP, urine culture result is abnormal. Advised patient to follow-up with cardiologist as soon as possible To use propranolol as prescribed To go to ED for worsening of symptoms

## 2019-09-27 ENCOUNTER — Other Ambulatory Visit: Payer: Self-pay

## 2019-09-27 LAB — CBC WITH DIFFERENTIAL/PLATELET
Basophils Absolute: 0.1 10*3/uL (ref 0.0–0.2)
Basos: 1 %
EOS (ABSOLUTE): 0.1 10*3/uL (ref 0.0–0.4)
Eos: 2 %
Hematocrit: 39.9 % (ref 34.0–46.6)
Hemoglobin: 13.9 g/dL (ref 11.1–15.9)
Immature Grans (Abs): 0 10*3/uL (ref 0.0–0.1)
Immature Granulocytes: 0 %
Lymphocytes Absolute: 1.5 10*3/uL (ref 0.7–3.1)
Lymphs: 26 %
MCH: 30.4 pg (ref 26.6–33.0)
MCHC: 34.8 g/dL (ref 31.5–35.7)
MCV: 87 fL (ref 79–97)
Monocytes Absolute: 0.4 10*3/uL (ref 0.1–0.9)
Monocytes: 8 %
Neutrophils Absolute: 3.7 10*3/uL (ref 1.4–7.0)
Neutrophils: 63 %
Platelets: 372 10*3/uL (ref 150–450)
RBC: 4.57 x10E6/uL (ref 3.77–5.28)
RDW: 12.4 % (ref 11.7–15.4)
WBC: 5.8 10*3/uL (ref 3.4–10.8)

## 2019-09-27 LAB — COMPREHENSIVE METABOLIC PANEL
ALT: 9 IU/L (ref 0–32)
AST: 13 IU/L (ref 0–40)
Albumin/Globulin Ratio: 2 (ref 1.2–2.2)
Albumin: 4.8 g/dL (ref 3.8–4.8)
Alkaline Phosphatase: 30 IU/L — ABNORMAL LOW (ref 39–117)
BUN/Creatinine Ratio: 11 (ref 9–23)
BUN: 10 mg/dL (ref 6–24)
Bilirubin Total: 0.9 mg/dL (ref 0.0–1.2)
CO2: 22 mmol/L (ref 20–29)
Calcium: 9.4 mg/dL (ref 8.7–10.2)
Chloride: 107 mmol/L — ABNORMAL HIGH (ref 96–106)
Creatinine, Ser: 0.9 mg/dL (ref 0.57–1.00)
GFR calc Af Amer: 91 mL/min/{1.73_m2} (ref >59)
GFR calc non Af Amer: 79 mL/min/{1.73_m2} (ref >59)
Globulin, Total: 2.4 g/dL (ref 1.5–4.5)
Glucose: 96 mg/dL (ref 65–99)
Potassium: 4.4 mmol/L (ref 3.5–5.2)
Sodium: 141 mmol/L (ref 134–144)
Total Protein: 7.2 g/dL (ref 6.0–8.5)

## 2019-09-27 LAB — URINE CULTURE: Culture: <10000 — AB

## 2019-10-20 ENCOUNTER — Other Ambulatory Visit: Payer: Self-pay

## 2019-10-20 ENCOUNTER — Ambulatory Visit: Payer: BC Managed Care – PPO | Attending: Internal Medicine

## 2019-10-20 DIAGNOSIS — Z20822 Contact with and (suspected) exposure to covid-19: Secondary | ICD-10-CM

## 2019-10-21 LAB — NOVEL CORONAVIRUS, NAA: SARS-CoV-2, NAA: NOT DETECTED

## 2019-12-19 ENCOUNTER — Encounter (HOSPITAL_COMMUNITY): Payer: Self-pay

## 2019-12-19 ENCOUNTER — Emergency Department (HOSPITAL_COMMUNITY): Payer: BC Managed Care – PPO

## 2019-12-19 ENCOUNTER — Emergency Department (HOSPITAL_COMMUNITY)
Admission: EM | Admit: 2019-12-19 | Discharge: 2019-12-19 | Disposition: A | Discharge status: Home / Self Care | Payer: BC Managed Care – PPO | Attending: Emergency Medicine | Admitting: Emergency Medicine

## 2019-12-19 DIAGNOSIS — K8050 Calculus of bile duct without cholangitis or cholecystitis without obstruction: Secondary | ICD-10-CM | POA: Insufficient documentation

## 2019-12-19 DIAGNOSIS — R109 Unspecified abdominal pain: Secondary | ICD-10-CM | POA: Diagnosis present

## 2019-12-19 DIAGNOSIS — R1011 Right upper quadrant pain: Secondary | ICD-10-CM | POA: Diagnosis not present

## 2019-12-19 DIAGNOSIS — R7401 Elevation of levels of liver transaminase levels: Secondary | ICD-10-CM | POA: Insufficient documentation

## 2019-12-19 LAB — CBC WITH DIFFERENTIAL/PLATELET
Abs Immature Granulocytes: 0.02 10*3/uL (ref 0.00–0.07)
Basophils Absolute: 0.1 10*3/uL (ref 0.0–0.1)
Basophils Relative: 1 %
Eosinophils Absolute: 0.3 10*3/uL (ref 0.0–0.5)
Eosinophils Relative: 3 %
HCT: 41.5 % (ref 36.0–46.0)
Hemoglobin: 13.9 g/dL (ref 12.0–15.0)
Immature Granulocytes: 0 %
Lymphocytes Relative: 27 %
Lymphs Abs: 2.6 10*3/uL (ref 0.7–4.0)
MCH: 29.9 pg (ref 26.0–34.0)
MCHC: 33.5 g/dL (ref 30.0–36.0)
MCV: 89.2 fL (ref 80.0–100.0)
Monocytes Absolute: 0.8 10*3/uL (ref 0.1–1.0)
Monocytes Relative: 9 %
Neutro Abs: 5.7 10*3/uL (ref 1.7–7.7)
Neutrophils Relative %: 60 %
Platelets: 312 10*3/uL (ref 150–400)
RBC: 4.65 MIL/uL (ref 3.87–5.11)
RDW: 12.1 % (ref 11.5–15.5)
WBC: 9.5 10*3/uL (ref 4.0–10.5)
nRBC: 0 % (ref 0.0–0.2)

## 2019-12-19 LAB — COMPREHENSIVE METABOLIC PANEL
ALT: 46 U/L — ABNORMAL HIGH (ref 0–44)
AST: 94 U/L — ABNORMAL HIGH (ref 15–41)
Albumin: 4.2 g/dL (ref 3.5–5.0)
Alkaline Phosphatase: 38 U/L (ref 38–126)
Anion gap: 7 (ref 5–15)
BUN: 13 mg/dL (ref 6–20)
CO2: 22 mmol/L (ref 22–32)
Calcium: 9.3 mg/dL (ref 8.9–10.3)
Chloride: 105 mmol/L (ref 98–111)
Creatinine, Ser: 0.84 mg/dL (ref 0.44–1.00)
GFR calc Af Amer: >60 mL/min (ref >60)
GFR calc non Af Amer: >60 mL/min (ref >60)
Glucose, Bld: 101 mg/dL — ABNORMAL HIGH (ref 70–99)
Potassium: 3.5 mmol/L (ref 3.5–5.1)
Sodium: 134 mmol/L — ABNORMAL LOW (ref 135–145)
Total Bilirubin: 1.2 mg/dL (ref 0.3–1.2)
Total Protein: 7.5 g/dL (ref 6.5–8.1)

## 2019-12-19 LAB — LIPASE, BLOOD: Lipase: 36 U/L (ref 11–51)

## 2019-12-19 MED ORDER — FENTANYL CITRATE (PF) 100 MCG/2ML IJ SOLN
50.0000 ug | Freq: Once | INTRAMUSCULAR | Status: AC
  Administered 2019-12-19: 50 ug via INTRAVENOUS
  Filled 2019-12-19: qty 2

## 2019-12-19 MED ORDER — ONDANSETRON HCL 4 MG/2ML IJ SOLN
4.0000 mg | Freq: Once | INTRAMUSCULAR | Status: AC
  Administered 2019-12-19: 4 mg via INTRAVENOUS
  Filled 2019-12-19: qty 2

## 2019-12-19 MED ORDER — HYDROCODONE-ACETAMINOPHEN 5-325 MG PO TABS: 1.0000 | ORAL_TABLET | ORAL | 0 refills | Status: AC | PRN

## 2019-12-19 MED ORDER — ONDANSETRON 4 MG PO TBDP: ORAL_TABLET | ORAL | 0 refills | Status: DC

## 2019-12-19 NOTE — ED Provider Notes (Signed)
[facility] Provider Note   CSN: [id number] Arrival date & time: 12/19/19  [id number]   History Chief Complaint  Patient presents with  . Abdominal Pain    Erin Klein is a 44 y.o. female.  The history is provided by the patient.  Abdominal Pain She has no significant past history and comes in because of right upper quadrant pain.  She first had pain after dinner, which was a cheeseburger.  There has been some associated nausea.  Pain is radiating to the back.  Pain started to subside and she did go to sleep, but was awakened by severe pain in the same location.  She rated pain at 8/10.  She had a similar episode a couple of weeks ago.  She tried taking a dose of famotidine tonight with no relief.  Past Medical History:  Diagnosis Date  . Contact dermatitis due to chemicals 09/12/2017  . Dermoid cyst    Removed  . Epistaxis 09/12/2017  . Mitral regurgitation    Mild  . Palpitations    Rare PVCs by cardiac monitor    Patient Active Problem List   Diagnosis Date Noted  . Contact dermatitis due to chemicals 09/12/2017  . Epistaxis 09/12/2017    Past Surgical History:  Procedure Laterality Date  . Dermoid cyst resection    . WISDOM TOOTH EXTRACTION       OB History   No obstetric history on file.     Family History  Problem Relation Age of Onset  . Hypertension Mother   . Hypertension Father   . Hypertension Brother     Social History   Tobacco Use  . Smoking status: Never Smoker  . Smokeless tobacco: Never Used  Substance Use Topics  . Alcohol use: Yes    Alcohol/week: 0.0 standard drinks  . Drug use: No    Home Medications Prior to Admission medications   Medication Sig Start Date End Date Taking? Authorizing Provider  propranolol (INDERAL) 10 MG tablet Take one tab by mouth daily as needed 01/21/18   Fay Records, MD    Allergies    Morphine and related  Review of Systems   Review of Systems  Gastrointestinal: Positive for  abdominal pain.  All other systems reviewed and are negative.   Physical Exam Updated Vital Signs BP 139/89 (BP Location: Left Arm)   Pulse 82   Temp 98.7 F (37.1 C) (Oral)   Resp 16   Ht 5\' 9"  (1.753 m)   Wt 86.2 kg   LMP 12/04/2019 (Approximate)   SpO2 98%   BMI 28.06 kg/m   Physical Exam Vitals and nursing note reviewed.   44 year old female, resting comfortably and in no acute distress. Vital signs are normal. Oxygen saturation is 98%, which is normal. Head is normocephalic and atraumatic. PERRLA, EOMI. Oropharynx is clear. Neck is nontender and supple without adenopathy or JVD. Back is nontender and there is no CVA tenderness. Lungs are clear without rales, wheezes, or rhonchi. Chest is nontender. Heart has regular rate and rhythm without murmur. Abdomen is soft, flat, with well localized right upper quadrant tenderness with a positive Murphy sign.  There are no masses or hepatosplenomegaly and peristalsis is hypoactive. Extremities have no cyanosis or edema, full range of motion is present. Skin is warm and dry without rash. Neurologic: Mental status is normal, cranial nerves are intact, there are no motor or sensory deficits.  ED Results / Procedures / Treatments   Labs (all  labs ordered are listed, but only abnormal results are displayed) Labs Reviewed  COMPREHENSIVE METABOLIC PANEL - Abnormal; Notable for the following components:      Result Value   Sodium 134 (*)    Glucose, Bld 101 (*)    AST 94 (*)    ALT 46 (*)    All other components within normal limits  LIPASE, BLOOD  CBC WITH DIFFERENTIAL/PLATELET   Radiology No results found.  Procedures Procedures  Medications Ordered in ED Medications  ondansetron (ZOFRAN) injection 4 mg (4 mg Intravenous Given 12/19/19 0322)  fentaNYL (SUBLIMAZE) injection 50 mcg (50 mcg Intravenous Given 12/19/19 0323)    ED Course  I have reviewed the triage vital signs and the nursing notes.  Pertinent labs &  imaging results that were available during my care of the patient were reviewed by me and considered in my medical decision making (see chart for details).  MDM Rules/Calculators/A&P Right upper quadrant pain suspicious for biliary colic.  Consider pancreatitis, diverticulitis.  She will be given ondansetron for nausea.  She had some dysphoria when given morphine in the past, will give dose of fentanyl for pain control.  Screening labs are obtained.  Old records are reviewed, and she has no relevant past visits.  She had good relief of pain with fentanyl.  Labs show mild elevation of AST, minimal elevation of ALT.  This may be related to cholelithiasis.  She will be sent for right upper quadrant ultrasound.  Case is signed out to Dr. Reather Converse.  Final Clinical Impression(s) / ED Diagnoses Final diagnoses:  RUQ pain  Elevated transaminase level    Rx / DC Orders ED Discharge Orders    None       Delora Fuel, MD [id number] [phone]

## 2019-12-19 NOTE — ED Provider Notes (Signed)
Patient CARE signed out to follow-up ultrasound results and discharged to follow-up with general surgery.  Patient presents with intermittent gradually worsening pain right upper quadrant.  Patient has mild elevated liver function.  No fever or chills.  No urinary symptoms.  Ultrasound images and results reviewed with patient showing sludge and no signs of cholecystitis.  Patient's pain overall controlled.  Discussed avoiding fatty foods, pain control and to call the surgeon for appointment.  Golda Acre, MD 12/19/19 [phone]

## 2019-12-19 NOTE — ED Triage Notes (Signed)
Pt c/o pain up under her right ribcage, states her sister and mother both had gallbladder problems with the same symptoms.  Pt reports nausea, no vomiting.

## 2019-12-19 NOTE — ED Notes (Signed)
Pt currently waiting for ultrasound

## 2019-12-19 NOTE — Discharge Instructions (Addendum)
Prescriptions for nausea and vomiting and pain have been called in your pharmacy. Avoid fatty foods as discussed and call local surgeon today for soonest appointment. Return to the emergency room for uncontrolled pain, persistent vomiting, fevers or new concerns.  For severe pain take norco or vicodin however realize they have the potential for addiction and it can make you sleepy and has tylenol in it.  No operating machinery while taking.

## 2019-12-30 ENCOUNTER — Other Ambulatory Visit (HOSPITAL_COMMUNITY)
Admission: RE | Admit: 2019-12-30 | Discharge: 2019-12-30 | Disposition: A | Discharge status: Home / Self Care | Payer: BC Managed Care – PPO | Source: Ambulatory Visit | Attending: General Surgery | Admitting: General Surgery

## 2019-12-30 ENCOUNTER — Ambulatory Visit: Payer: BC Managed Care – PPO | Admitting: General Surgery

## 2019-12-30 ENCOUNTER — Other Ambulatory Visit: Payer: Self-pay

## 2019-12-30 ENCOUNTER — Encounter: Payer: Self-pay | Admitting: General Surgery

## 2019-12-30 VITALS — BP 126/72 | HR 89 | Temp 98.2°F | Resp 12 | Ht 69.0 in | Wt 192.0 lb

## 2019-12-30 DIAGNOSIS — K828 Other specified diseases of gallbladder: Secondary | ICD-10-CM | POA: Insufficient documentation

## 2019-12-30 DIAGNOSIS — R1011 Right upper quadrant pain: Secondary | ICD-10-CM | POA: Diagnosis present

## 2019-12-30 DIAGNOSIS — K219 Gastro-esophageal reflux disease without esophagitis: Secondary | ICD-10-CM | POA: Diagnosis not present

## 2019-12-30 DIAGNOSIS — K8010 Calculus of gallbladder with chronic cholecystitis without obstruction: Secondary | ICD-10-CM | POA: Diagnosis not present

## 2019-12-30 DIAGNOSIS — Z20822 Contact with and (suspected) exposure to covid-19: Secondary | ICD-10-CM | POA: Diagnosis not present

## 2019-12-30 NOTE — Progress Notes (Signed)
[facility] History and Physical  Reason for Referral: Gallbladder sludge  Referring Physician:  ED   Chief Complaint    New Patient (Initial Visit)      Erin Klein is a 44 y.o. female.  HPI: Erin Klein is a very sweet 45 yo who comes in with complaints of an episode of RUQ pain that started after eating a cheeseburger. She was seen in the ED 12/19/2019 after she developed pain that was severe and sharp in the RUQ. She says that in the past she had one other episode about 2 weeks prior to that and was eating humus at that time. She says that the only thing that has changed is that she takes Naproxen now instead of ibuprofen for cramps around her period, but that this is only for a few days a month. She denies any excessive use of NSAIDs.  She reports that since the episode that brought her to the ED she has avoided fatty foods. She is worried about getting another attack. She tried to take famotidine during the attack with no relief and says that she had no vomiting and some minor nausea.   Past Medical History:  Diagnosis Date  . Contact dermatitis due to chemicals 09/12/2017  . Dermoid cyst    Removed  . Epistaxis 09/12/2017  . Mitral regurgitation    Mild  . Palpitations    Rare PVCs by cardiac monitor    Past Surgical History:  Procedure Laterality Date  . Dermoid cyst resection    . WISDOM TOOTH EXTRACTION      Family History  Problem Relation Age of Onset  . Hypertension Mother   . Hypertension Father   . Hypertension Brother     Social History   Tobacco Use  . Smoking status: Never Smoker  . Smokeless tobacco: Never Used  Substance Use Topics  . Alcohol use: Yes    Alcohol/week: 0.0 standard drinks  . Drug use: No    Medications: I have reviewed the patient's current medications. Allergies as of 12/30/2019      Reactions   Morphine And Related    Pt states she does not want morphine      Medication List       Accurate as of December 30, 2019  9:41 AM. If you have any questions, ask your nurse or doctor.        naproxen 500 MG tablet Commonly known as: NAPROSYN Take 500 mg by mouth 2 (two) times daily.   ondansetron 4 MG disintegrating tablet Commonly known as: Zofran ODT 4mg  ODT q4 hours prn nausea/vomit   propranolol 10 MG tablet Commonly known as: INDERAL Take one tab by mouth daily as needed What changed: Another medication with the same name was removed. Continue taking this medication, and follow the directions you see here. Changed by: Virl Cagey, MD        ROS:  A comprehensive review of systems was negative except for: Gastrointestinal: positive for abdominal pain and reflux symptoms  Blood pressure 126/72, pulse 89, temperature 98.2 F (36.8 C), temperature source Oral, resp. rate 12, height 5\' 9"  (1.753 m), weight 192 lb (87.1 kg), last menstrual period 12/04/2019, SpO2 97 %. Physical Exam Vitals reviewed.  Constitutional:      Appearance: She is normal weight.  HENT:     Head: Normocephalic and atraumatic.     Nose: Nose normal.     Mouth/Throat:     Mouth: Mucous membranes are moist.  Eyes:     Extraocular Movements: Extraocular movements intact.     Pupils: Pupils are equal, round, and reactive to light.  Cardiovascular:     Rate and Rhythm: Normal rate and regular rhythm.  Pulmonary:     Effort: Pulmonary effort is normal.     Breath sounds: Normal breath sounds.  Abdominal:     General: There is no distension.     Palpations: Abdomen is soft.     Tenderness: There is no abdominal tenderness.  Musculoskeletal:        General: No swelling. Normal range of motion.     Cervical back: Normal range of motion. No rigidity.  Skin:    General: Skin is warm and dry.  Neurological:     General: No focal deficit present.     Mental Status: She is alert and oriented to person, place, and time.  Psychiatric:        Mood and Affect: Mood normal.        Behavior: Behavior normal.         Thought Content: Thought content normal.        Judgment: Judgment normal.     Results: Results for Erin, Klein (MRN [id number]) as of 12/30/2019 09:42  Ref. Range 12/19/2019 03:21  Sodium Latest Ref Range: 135 - 145 mmol/L 134 (L)  Potassium Latest Ref Range: 3.5 - 5.1 mmol/L 3.5  Chloride Latest Ref Range: 98 - 111 mmol/L 105  CO2 Latest Ref Range: 22 - 32 mmol/L 22  Glucose Latest Ref Range: 70 - 99 mg/dL 101 (H)  BUN Latest Ref Range: 6 - 20 mg/dL 13  Creatinine Latest Ref Range: 0.44 - 1.00 mg/dL 0.84  Calcium Latest Ref Range: 8.9 - 10.3 mg/dL 9.3  Anion gap Latest Ref Range: 5 - 15  7  Alkaline Phosphatase Latest Ref Range: 38 - 126 U/L 38  Albumin Latest Ref Range: 3.5 - 5.0 g/dL 4.2  Lipase Latest Ref Range: 11 - 51 U/L 36  AST Latest Ref Range: 15 - 41 U/L 94 (H)  ALT Latest Ref Range: 0 - 44 U/L 46 (H)  Total Protein Latest Ref Range: 6.5 - 8.1 g/dL 7.5  Total Bilirubin Latest Ref Range: 0.3 - 1.2 mg/dL 1.2  GFR, Est Non African American Latest Ref Range: >60 mL/min >60  GFR, Est African American Latest Ref Range: >60 mL/min >60   Personally reviewed imaging- layering sludge in gallbladder   CLINICAL DATA:  Acute right upper quadrant abdominal pain.  EXAM: ULTRASOUND ABDOMEN LIMITED RIGHT UPPER QUADRANT  COMPARISON:  None.  FINDINGS: Gallbladder:  No gallstones or wall thickening visualized. No sonographic Murphy sign noted by sonographer. Probable tumefactive sludge seen in gallbladder lumen.  Common bile duct:  Diameter: 4 mm which is within normal limits.  Liver:  No focal lesion identified. Within normal limits in parenchymal echogenicity. Portal vein is patent on color Doppler imaging with normal direction of blood flow towards the liver.  Other: None.  IMPRESSION: Probable sludge noted within gallbladder lumen. No other abnormality seen in the right upper quadrant of the abdomen.   Electronically Signed   By: Marijo Conception M.D.   On: 12/19/2019 08:42   Assessment & Plan:  Erin Klein is a 44 y.o. female with gallbladder sludge/ small stones and biliary colic type symptoms. She has had several attacks in the last few weeks.   -PLAN: I counseled the patient about the indication, risks and benefits of laparoscopic cholecystectomy.  She understands there is a very small chance for bleeding, infection, injury to normal structures (including common bile duct), conversion to open surgery, persistent symptoms, evolution of postcholecystectomy diarrhea, need for secondary interventions, anesthesia reaction, cardiopulmonary issues and other risks not specifically detailed here. I described the expected recovery, the plan for follow-up and the restrictions during the recovery phase.  All questions were answered.  -She had recent Labs and will need COVID testing.  -Informed her someone will need to stay with her for 24 hrs due to anesthesia since she lives alone   All questions were answered to the satisfaction of the patient.    Virl Cagey 12/30/2019, 9:41 AM

## 2019-12-30 NOTE — Patient Instructions (Signed)
Laparoscopic Cholecystectomy Laparoscopic cholecystectomy is surgery to remove the gallbladder. The gallbladder is a pear-shaped organ that lies beneath the liver on the right side of the body. The gallbladder stores bile, which is a fluid that helps the body to digest fats. Cholecystectomy is often done for inflammation of the gallbladder (cholecystitis). This condition is usually caused by a buildup of gallstones (cholelithiasis) in the gallbladder. Gallstones can block the flow of bile, which can result in inflammation and pain. In severe cases, emergency surgery may be required. This procedure is done though small incisions in your abdomen (laparoscopic surgery). A thin scope with a camera (laparoscope) is inserted through one incision. Thin surgical instruments are inserted through the other incisions. In some cases, a laparoscopic procedure may be turned into a type of surgery that is done through a larger incision (open surgery). Tell a health care provider about:  Any allergies you have.  All medicines you are taking, including vitamins, herbs, eye drops, creams, and over-the-counter medicines.  Any problems you or family members have had with anesthetic medicines.  Any blood disorders you have.  Any surgeries you have had.  Any medical conditions you have.  Whether you are pregnant or may be pregnant. What are the risks? Generally, this is a safe procedure. However, problems may occur, including:  Infection.  Bleeding.  Allergic reactions to medicines.  Damage to other structures or organs.  A stone remaining in the common bile duct. The common bile duct carries bile from the gallbladder into the small intestine.  A bile leak from the cyst duct that is clipped when your gallbladder is removed. Medicines  Ask your health care provider about: ? Changing or stopping your regular medicines. This is especially important if you are taking diabetes medicines or blood  thinners. ? Taking medicines such as aspirin and ibuprofen. These medicines can thin your blood. Do not take these medicines before your procedure if your health care provider instructs you not to.  You may be given antibiotic medicine to help prevent infection. General instructions  Let your health care provider know if you develop a cold or an infection before surgery.  Plan to have someone take you home from the hospital or clinic.  Ask your health care provider how your surgical site will be marked or identified. What happens during the procedure?   To reduce your risk of infection: ? Your health care team will wash or sanitize their hands. ? Your skin will be washed with soap. ? Hair may be removed from the surgical area.  An IV tube may be inserted into one of your veins.  You will be given one or more of the following: ? A medicine to help you relax (sedative). ? A medicine to make you fall asleep (general anesthetic).  A breathing tube will be placed in your mouth.  Your surgeon will make several small cuts (incisions) in your abdomen.  The laparoscope will be inserted through one of the small incisions. The camera on the laparoscope will send images to a TV screen (monitor) in the operating room. This lets your surgeon see inside your abdomen.  Air-like gas will be pumped into your abdomen. This will expand your abdomen to give the surgeon more room to perform the surgery.  Other tools that are needed for the procedure will be inserted through the other incisions. The gallbladder will be removed through one of the incisions.  Your common bile duct may be examined. If stones are found  in the common bile duct, they may be removed.  After your gallbladder has been removed, the incisions will be closed with stitches (sutures), staples, or skin glue.  Your incisions may be covered with a bandage (dressing). The procedure may vary among health care providers and  hospitals. What happens after the procedure?  Your blood pressure, heart rate, breathing rate, and blood oxygen level will be monitored until the medicines you were given have worn off.  You will be given medicines as needed to control your pain.  Do not drive for 24 hours if you were given a sedative. This information is not intended to replace advice given to you by your health care provider. Make sure you discuss any questions you have with your health care provider. Document Revised: 08/24/2017 Document Reviewed: 02/28/2016 Elsevier Patient Education  2020 [employer].   Cholelithiasis/ Gallbladder sludge- tiny gravel stones  Cholelithiasis is a form of gallbladder disease in which gallstones form in the gallbladder. The gallbladder is an organ that stores bile. Bile is made in the liver, and it helps to digest fats. Gallstones begin as small crystals and slowly grow into stones. They may cause no symptoms until the gallbladder tightens (contracts) and a gallstone is blocking the duct (gallbladder attack), which can cause pain. Cholelithiasis is also referred to as gallstones. There are two main types of gallstones:  Cholesterol stones. These are made of hardened cholesterol and are usually yellow-green in color. They are the most common type of gallstone. Cholesterol is a white, waxy, fat-like substance that is made in the liver.  Pigment stones. These are dark in color and are made of a red-yellow substance that forms when hemoglobin from red blood cells breaks down (bilirubin). What are the causes? This condition may be caused by an imbalance in the substances that bile is made of. This can happen if the bile:  Has too much bilirubin.  Has too much cholesterol.  Does not have enough bile salts. These salts help the body absorb and digest fats. In some cases, this condition can also be caused by the gallbladder not emptying completely or often enough. What increases the  risk? The following factors may make you more likely to develop this condition:  Being female.  Having multiple pregnancies. Health care providers sometimes advise removing diseased gallbladders before future pregnancies.  Eating a diet that is heavy in fried foods, fat, and refined carbohydrates, like white bread and white rice.  Being obese.  Being older than age 71.  Prolonged use of medicines that contain female hormones (estrogen).  Having diabetes mellitus.  Rapidly losing weight.  Having a family history of gallstones.  Being of [place] or [place] descent.  Having an intestinal disease such as Crohn disease.  Having metabolic syndrome.  Having cirrhosis.  Having severe types of anemia such as sickle cell anemia. What are the signs or symptoms? In most cases, there are no symptoms. These are known as silent gallstones. If a gallstone blocks the bile ducts, it can cause a gallbladder attack. The main symptom of a gallbladder attack is sudden pain in the upper right abdomen. The pain usually comes at night or after eating a large meal. The pain can last for one or several hours and can spread to the right shoulder or chest. If the bile duct is blocked for more than a few hours, it can cause infection or inflammation of the gallbladder, liver, or pancreas, which may cause:  Nausea.  Vomiting.  Abdominal pain that lasts for 5 hours or more.  Fever or chills.  Yellowing of the skin or the whites of the eyes (jaundice).  Dark urine.  Light-colored stools. How is this diagnosed? This condition may be diagnosed based on:  A physical exam.  Your medical history.  An ultrasound of your gallbladder.  CT scan.  MRI.  Blood tests to check for signs of infection or inflammation.  A scan of your gallbladder and bile ducts (biliary system) using nonharmful radioactive material and special cameras that can see the radioactive material (cholescintigram). This  test checks to see how your gallbladder contracts and whether bile ducts are blocked.  Inserting a small tube with a camera on the end (endoscope) through your mouth to inspect bile ducts and check for blockages (endoscopic retrograde cholangiopancreatogram). How is this treated? Treatment for gallstones depends on the severity of the condition. Silent gallstones do not need treatment. If the gallstones cause a gallbladder attack or other symptoms, treatment may be required. Options for treatment include:  Surgery to remove the gallbladder (cholecystectomy). This is the most common treatment.  Medicines to dissolve gallstones. These are most effective at treating small gallstones. You may need to take medicines for up to 6-12 months.  Shock wave treatment (extracorporeal biliary lithotripsy). In this treatment, an ultrasound machine sends shock waves to the gallbladder to break gallstones into smaller pieces. These pieces can then be passed into the intestines or be dissolved by medicine. This is rarely used.  Removing gallstones through endoscopic retrograde cholangiopancreatogram. A small basket can be attached to the endoscope and used to capture and remove gallstones. Follow these instructions at home:  Take over-the-counter and prescription medicines only as told by your health care provider.  Maintain a healthy weight and follow a healthy diet. This includes: ? Reducing fatty foods, such as fried food. ? Reducing refined carbohydrates, like white bread and white rice. ? Increasing fiber. Aim for foods like almonds, fruit, and beans.  Keep all follow-up visits as told by your health care provider. This is important. Contact a health care provider if:  You think you have had a gallbladder attack.  You have been diagnosed with silent gallstones and you develop abdominal pain or indigestion. Get help right away if:  You have pain from a gallbladder attack that lasts for more than 2  hours.  You have abdominal pain that lasts for more than 5 hours.  You have a fever or chills.  You have persistent nausea and vomiting.  You develop jaundice.  You have dark urine or light-colored stools. Summary  Cholelithiasis (also called gallstones) is a form of gallbladder disease in which gallstones form in the gallbladder.  This condition is caused by an imbalance in the substances that make up bile. This can happen if the bile has too much cholesterol, too much bilirubin, or not enough bile salts.  You are more likely to develop this condition if you are female, pregnant, using medicines with estrogen, obese, older than age 45, or have a family history of gallstones. You may also develop gallstones if you have diabetes, an intestinal disease, cirrhosis, or metabolic syndrome.  Treatment for gallstones depends on the severity of the condition. Silent gallstones do not need treatment.  If gallstones cause a gallbladder attack or other symptoms, treatment may be needed. The most common treatment is surgery to remove the gallbladder. This information is not intended to replace advice given to you by your health care provider. Make  sure you discuss any questions you have with your health care provider. Document Revised: 08/24/2017 Document Reviewed: 05/28/2016 Elsevier Patient Education  2020 [employer].

## 2019-12-31 ENCOUNTER — Encounter (HOSPITAL_COMMUNITY): Payer: Self-pay

## 2019-12-31 ENCOUNTER — Other Ambulatory Visit: Payer: Self-pay

## 2019-12-31 ENCOUNTER — Encounter (HOSPITAL_COMMUNITY)
Admission: RE | Admit: 2019-12-31 | Discharge: 2019-12-31 | Disposition: A | Discharge status: Home / Self Care | Payer: BC Managed Care – PPO | Source: Ambulatory Visit | Attending: General Surgery | Admitting: General Surgery

## 2019-12-31 LAB — SARS CORONAVIRUS 2 (TAT 6-24 HRS): SARS Coronavirus 2: NEGATIVE

## 2019-12-31 NOTE — H&P (Signed)
[facility]  Reason for Referral: Gallbladder sludge  Referring Physician:  ED   Chief Complaint    New Patient (Initial Visit)      Erin Klein is a 44 y.o. female.  HPI: Erin Klein is a very sweet 44 yo who comes in with complaints of an episode of RUQ pain that started after eating a cheeseburger. She was seen in the ED 12/19/2019 after she developed pain that was severe and sharp in the RUQ. She says that in the past she had one other episode about 2 weeks prior to that and was eating humus at that time. She says that the only thing that has changed is that she takes Naproxen now instead of ibuprofen for cramps around her period, but that this is only for a few days a month. She denies any excessive use of NSAIDs.  She reports that since the episode that brought her to the ED she has avoided fatty foods. She is worried about getting another attack. She tried to take famotidine during the attack with no relief and says that she had no vomiting and some minor nausea.   Past Medical History:  Diagnosis Date  . Contact dermatitis due to chemicals 09/12/2017  . Dermoid cyst    Removed  . Epistaxis 09/12/2017  . Mitral regurgitation    Mild  . Palpitations    Rare PVCs by cardiac monitor    Past Surgical History:  Procedure Laterality Date  . Dermoid cyst resection    . WISDOM TOOTH EXTRACTION      Family History  Problem Relation Age of Onset  . Hypertension Mother   . Hypertension Father   . Hypertension Brother     Social History   Tobacco Use  . Smoking status: Never Smoker  . Smokeless tobacco: Never Used  Substance Use Topics  . Alcohol use: Yes    Alcohol/week: 0.0 standard drinks  . Drug use: No    Medications: I have reviewed the patient's current medications. Allergies as of 12/30/2019      Reactions   Morphine And Related    Pt states she does not want morphine      Medication List       Accurate as of December 30, 2019  9:41 AM. If you have any questions, ask your nurse or doctor.        naproxen 500 MG tablet Commonly known as: NAPROSYN Take 500 mg by mouth 2 (two) times daily.   ondansetron 4 MG disintegrating tablet Commonly known as: Zofran ODT 4mg  ODT q4 hours prn nausea/vomit   propranolol 10 MG tablet Commonly known as: INDERAL Take one tab by mouth daily as needed What changed: Another medication with the same name was removed. Continue taking this medication, and follow the directions you see here. Changed by: Virl Cagey, MD        ROS:  A comprehensive review of systems was negative except for: Gastrointestinal: positive for abdominal pain and reflux symptoms  Blood pressure 126/72, pulse 89, temperature 98.2 F (36.8 C), temperature source Oral, resp. rate 12, height 5\' 9"  (1.753 m), weight 192 lb (87.1 kg), last menstrual period 12/04/2019, SpO2 97 %. Physical Exam Vitals reviewed.  Constitutional:      Appearance: She is normal weight.  HENT:     Head: Normocephalic and atraumatic.     Nose: Nose normal.     Mouth/Throat:     Mouth: Mucous membranes are moist.  Eyes:     Extraocular Movements: Extraocular movements intact.     Pupils: Pupils are equal, round, and reactive to light.  Cardiovascular:     Rate and Rhythm: Normal rate and regular rhythm.  Pulmonary:     Effort: Pulmonary effort is normal.     Breath sounds: Normal breath sounds.  Abdominal:     General: There is no distension.     Palpations: Abdomen is soft.     Tenderness: There is no abdominal tenderness.  Musculoskeletal:        General: No swelling. Normal range of motion.     Cervical back: Normal range of motion. No rigidity.  Skin:    General: Skin is warm and dry.  Neurological:     General: No focal deficit present.     Mental Status: She is alert and oriented to person, place, and time.  Psychiatric:        Mood and Affect: Mood normal.        Behavior: Behavior normal.         Thought Content: Thought content normal.        Judgment: Judgment normal.     Results: Results for ETHELEE, GAGEL (MRN [id number]) as of 12/30/2019 09:42  Ref. Range 12/19/2019 03:21  Sodium Latest Ref Range: 135 - 145 mmol/L 134 (L)  Potassium Latest Ref Range: 3.5 - 5.1 mmol/L 3.5  Chloride Latest Ref Range: 98 - 111 mmol/L 105  CO2 Latest Ref Range: 22 - 32 mmol/L 22  Glucose Latest Ref Range: 70 - 99 mg/dL 101 (H)  BUN Latest Ref Range: 6 - 20 mg/dL 13  Creatinine Latest Ref Range: 0.44 - 1.00 mg/dL 0.84  Calcium Latest Ref Range: 8.9 - 10.3 mg/dL 9.3  Anion gap Latest Ref Range: 5 - 15  7  Alkaline Phosphatase Latest Ref Range: 38 - 126 U/L 38  Albumin Latest Ref Range: 3.5 - 5.0 g/dL 4.2  Lipase Latest Ref Range: 11 - 51 U/L 36  AST Latest Ref Range: 15 - 41 U/L 94 (H)  ALT Latest Ref Range: 0 - 44 U/L 46 (H)  Total Protein Latest Ref Range: 6.5 - 8.1 g/dL 7.5  Total Bilirubin Latest Ref Range: 0.3 - 1.2 mg/dL 1.2  GFR, Est Non African American Latest Ref Range: >60 mL/min >60  GFR, Est African American Latest Ref Range: >60 mL/min >60   Personally reviewed imaging- layering sludge in gallbladder   CLINICAL DATA:  Acute right upper quadrant abdominal pain.  EXAM: ULTRASOUND ABDOMEN LIMITED RIGHT UPPER QUADRANT  COMPARISON:  None.  FINDINGS: Gallbladder:  No gallstones or wall thickening visualized. No sonographic Murphy sign noted by sonographer. Probable tumefactive sludge seen in gallbladder lumen.  Common bile duct:  Diameter: 4 mm which is within normal limits.  Liver:  No focal lesion identified. Within normal limits in parenchymal echogenicity. Portal vein is patent on color Doppler imaging with normal direction of blood flow towards the liver.  Other: None.  IMPRESSION: Probable sludge noted within gallbladder lumen. No other abnormality seen in the right upper quadrant of the abdomen.   Electronically Signed   By: Marijo Conception M.D.   On: 12/19/2019 08:42   Assessment & Plan:  Erin Klein is a 44 y.o. female with gallbladder sludge/ small stones and biliary colic type symptoms. She has had several attacks in the last few weeks.   -PLAN: I counseled the patient about the indication, risks and benefits of laparoscopic cholecystectomy.  She understands there is a very small chance for bleeding, infection, injury to normal structures (including common bile duct), conversion to open surgery, persistent symptoms, evolution of postcholecystectomy diarrhea, need for secondary interventions, anesthesia reaction, cardiopulmonary issues and other risks not specifically detailed here. I described the expected recovery, the plan for follow-up and the restrictions during the recovery phase.  All questions were answered.  -She had recent Labs and will need COVID testing.  -Informed her someone will need to stay with her for 24 hrs due to anesthesia since she lives alone   All questions were answered to the satisfaction of the patient.    Virl Cagey 12/30/2019, 9:41 AM

## 2020-01-01 ENCOUNTER — Ambulatory Visit (HOSPITAL_COMMUNITY): Payer: BC Managed Care – PPO | Admitting: Anesthesiology

## 2020-01-01 ENCOUNTER — Ambulatory Visit (HOSPITAL_COMMUNITY)
Admission: RE | Admit: 2020-01-01 | Discharge: 2020-01-01 | Disposition: A | Discharge status: Home / Self Care | Payer: BC Managed Care – PPO | Attending: General Surgery | Admitting: General Surgery

## 2020-01-01 ENCOUNTER — Encounter (HOSPITAL_COMMUNITY)
Admission: RE | Disposition: A | Discharge status: Home / Self Care | Payer: Self-pay | Source: Home / Self Care | Attending: General Surgery

## 2020-01-01 ENCOUNTER — Encounter (HOSPITAL_COMMUNITY): Payer: Self-pay | Admitting: General Surgery

## 2020-01-01 DIAGNOSIS — K828 Other specified diseases of gallbladder: Secondary | ICD-10-CM | POA: Diagnosis not present

## 2020-01-01 DIAGNOSIS — K8010 Calculus of gallbladder with chronic cholecystitis without obstruction: Secondary | ICD-10-CM | POA: Diagnosis not present

## 2020-01-01 DIAGNOSIS — Z20822 Contact with and (suspected) exposure to covid-19: Secondary | ICD-10-CM | POA: Insufficient documentation

## 2020-01-01 DIAGNOSIS — K219 Gastro-esophageal reflux disease without esophagitis: Secondary | ICD-10-CM | POA: Insufficient documentation

## 2020-01-01 HISTORY — PX: CHOLECYSTECTOMY: SHX55

## 2020-01-01 LAB — PREGNANCY, URINE: Preg Test, Ur: NEGATIVE

## 2020-01-01 SURGERY — LAPAROSCOPIC CHOLECYSTECTOMY
Anesthesia: General | Site: Abdomen

## 2020-01-01 MED ORDER — ONDANSETRON HCL 4 MG/2ML IJ SOLN
INTRAMUSCULAR | Status: AC
  Filled 2020-01-01: qty 2

## 2020-01-01 MED ORDER — LIDOCAINE 2% (20 MG/ML) 5 ML SYRINGE
INTRAMUSCULAR | Status: AC
  Filled 2020-01-01: qty 5

## 2020-01-01 MED ORDER — BUPIVACAINE HCL (PF) 0.5 % IJ SOLN
INTRAMUSCULAR | Status: DC | PRN
  Administered 2020-01-01: 10 mL

## 2020-01-01 MED ORDER — HYDROMORPHONE HCL 1 MG/ML IJ SOLN
0.2500 mg | INTRAMUSCULAR | Status: DC | PRN
  Administered 2020-01-01: 0.5 mg via INTRAVENOUS
  Filled 2020-01-01: qty 0.5

## 2020-01-01 MED ORDER — SODIUM CHLORIDE 0.9 % IR SOLN
Status: DC | PRN
  Administered 2020-01-01: 3000 mL

## 2020-01-01 MED ORDER — MIDAZOLAM HCL 2 MG/2ML IJ SOLN
2.0000 mg | Freq: Once | INTRAMUSCULAR | Status: AC
  Administered 2020-01-01: 2 mg via INTRAVENOUS

## 2020-01-01 MED ORDER — OXYCODONE HCL 5 MG PO TABS: 5.0000 mg | ORAL_TABLET | ORAL | 0 refills | Status: AC | PRN

## 2020-01-01 MED ORDER — MIDAZOLAM HCL 5 MG/5ML IJ SOLN
INTRAMUSCULAR | Status: DC | PRN
  Administered 2020-01-01: 2 mg via INTRAVENOUS

## 2020-01-01 MED ORDER — FENTANYL CITRATE (PF) 250 MCG/5ML IJ SOLN
INTRAMUSCULAR | Status: AC
  Filled 2020-01-01: qty 5

## 2020-01-01 MED ORDER — MEPERIDINE HCL 50 MG/ML IJ SOLN: 6.2500 mg | INTRAMUSCULAR | Status: DC | PRN

## 2020-01-01 MED ORDER — SODIUM CHLORIDE 0.9 % IR SOLN
Status: DC | PRN
  Administered 2020-01-01: 1000 mL

## 2020-01-01 MED ORDER — SODIUM CHLORIDE 0.9 % IV SOLN
2.0000 g | INTRAVENOUS | Status: AC
  Administered 2020-01-01: 2 g via INTRAVENOUS

## 2020-01-01 MED ORDER — PROPOFOL 10 MG/ML IV BOLUS
INTRAVENOUS | Status: DC | PRN
  Administered 2020-01-01: 150 mg via INTRAVENOUS

## 2020-01-01 MED ORDER — DOCUSATE SODIUM 100 MG PO CAPS: 100.0000 mg | ORAL_CAPSULE | Freq: Two times a day (BID) | ORAL | 2 refills | Status: AC

## 2020-01-01 MED ORDER — SUGAMMADEX SODIUM 200 MG/2ML IV SOLN
INTRAVENOUS | Status: DC | PRN
  Administered 2020-01-01: 200 mg via INTRAVENOUS

## 2020-01-01 MED ORDER — ROCURONIUM BROMIDE 10 MG/ML (PF) SYRINGE
PREFILLED_SYRINGE | INTRAVENOUS | Status: AC
  Filled 2020-01-01: qty 10

## 2020-01-01 MED ORDER — BUPIVACAINE HCL (PF) 0.5 % IJ SOLN
INTRAMUSCULAR | Status: AC
  Filled 2020-01-01: qty 30

## 2020-01-01 MED ORDER — HEMOSTATIC AGENTS (NO CHARGE) OPTIME
TOPICAL | Status: DC | PRN
  Administered 2020-01-01: 1 via TOPICAL

## 2020-01-01 MED ORDER — CHLORHEXIDINE GLUCONATE CLOTH 2 % EX PADS: 6.0000 | MEDICATED_PAD | Freq: Once | CUTANEOUS | Status: DC

## 2020-01-01 MED ORDER — MIDAZOLAM HCL 2 MG/2ML IJ SOLN
INTRAMUSCULAR | Status: AC
  Filled 2020-01-01: qty 2

## 2020-01-01 MED ORDER — ONDANSETRON HCL 4 MG/2ML IJ SOLN
INTRAMUSCULAR | Status: DC | PRN
  Administered 2020-01-01: 4 mg via INTRAVENOUS

## 2020-01-01 MED ORDER — PROMETHAZINE HCL 25 MG/ML IJ SOLN
6.2500 mg | INTRAMUSCULAR | Status: DC | PRN
  Administered 2020-01-01: 6.25 mg via INTRAVENOUS
  Filled 2020-01-01: qty 1

## 2020-01-01 MED ORDER — LACTATED RINGERS IV SOLN: INTRAVENOUS | Status: DC | PRN

## 2020-01-01 MED ORDER — PROPOFOL 10 MG/ML IV BOLUS
INTRAVENOUS | Status: AC
  Filled 2020-01-01: qty 20

## 2020-01-01 MED ORDER — DEXAMETHASONE SODIUM PHOSPHATE 10 MG/ML IJ SOLN
INTRAMUSCULAR | Status: AC
  Filled 2020-01-01: qty 1

## 2020-01-01 MED ORDER — DEXAMETHASONE SODIUM PHOSPHATE 10 MG/ML IJ SOLN
INTRAMUSCULAR | Status: DC | PRN
  Administered 2020-01-01: 10 mg via INTRAVENOUS

## 2020-01-01 MED ORDER — LACTATED RINGERS IV SOLN
Freq: Once | INTRAVENOUS | Status: AC
  Administered 2020-01-01: 12:00:00 1000 mL via INTRAVENOUS

## 2020-01-01 MED ORDER — SCOPOLAMINE 1 MG/3DAYS TD PT72
1.0000 | MEDICATED_PATCH | Freq: Once | TRANSDERMAL | Status: DC
  Administered 2020-01-01: 1.5 mg via TRANSDERMAL
  Filled 2020-01-01: qty 1

## 2020-01-01 MED ORDER — LIDOCAINE 2% (20 MG/ML) 5 ML SYRINGE
INTRAMUSCULAR | Status: DC | PRN
  Administered 2020-01-01: 80 mg via INTRAVENOUS

## 2020-01-01 MED ORDER — ROCURONIUM BROMIDE 50 MG/5ML IV SOSY
PREFILLED_SYRINGE | INTRAVENOUS | Status: DC | PRN
  Administered 2020-01-01: 50 mg via INTRAVENOUS

## 2020-01-01 MED ORDER — SODIUM CHLORIDE 0.9 % IV SOLN
INTRAVENOUS | Status: AC
  Filled 2020-01-01: qty 2

## 2020-01-01 MED ORDER — FENTANYL CITRATE (PF) 100 MCG/2ML IJ SOLN
INTRAMUSCULAR | Status: DC | PRN
  Administered 2020-01-01: 100 ug via INTRAVENOUS
  Administered 2020-01-01 (×3): 50 ug via INTRAVENOUS

## 2020-01-01 SURGICAL SUPPLY — 47 items
APPLIER CLIP ROT 10 11.4 M/L (STAPLE) ×3
BAG RETRIEVAL 10 (BASKET) ×1
BAG RETRIEVAL 10MM (BASKET) ×1
BLADE SURG 15 STRL LF DISP TIS (BLADE) ×1 IMPLANT
BLADE SURG 15 STRL SS (BLADE) ×2
CHLORAPREP W/TINT 26 (MISCELLANEOUS) ×3 IMPLANT
CLIP APPLIE ROT 10 11.4 M/L (STAPLE) ×1 IMPLANT
CLOTH BEACON ORANGE TIMEOUT ST (SAFETY) ×3 IMPLANT
COVER LIGHT HANDLE STERIS (MISCELLANEOUS) ×6 IMPLANT
COVER WAND RF STERILE (DRAPES) ×3 IMPLANT
DECANTER SPIKE VIAL GLASS SM (MISCELLANEOUS) ×3 IMPLANT
DERMABOND ADVANCED (GAUZE/BANDAGES/DRESSINGS) ×2
DERMABOND ADVANCED .7 DNX12 (GAUZE/BANDAGES/DRESSINGS) ×1 IMPLANT
ELECT REM PT RETURN 9FT ADLT (ELECTROSURGICAL) ×3
ELECTRODE REM PT RTRN 9FT ADLT (ELECTROSURGICAL) ×1 IMPLANT
GLOVE BIO SURGEON STRL SZ 6.5 (GLOVE) ×2 IMPLANT
GLOVE BIO SURGEON STRL SZ7 (GLOVE) ×6 IMPLANT
GLOVE BIO SURGEONS STRL SZ 6.5 (GLOVE) ×1
GLOVE BIOGEL PI IND STRL 6.5 (GLOVE) ×1 IMPLANT
GLOVE BIOGEL PI IND STRL 7.0 (GLOVE) ×4 IMPLANT
GLOVE BIOGEL PI INDICATOR 6.5 (GLOVE) ×2
GLOVE BIOGEL PI INDICATOR 7.0 (GLOVE) ×8
GLOVE ECLIPSE 6.5 STRL STRAW (GLOVE) ×3 IMPLANT
GOWN STRL REUS W/TWL LRG LVL3 (GOWN DISPOSABLE) ×9 IMPLANT
HEMOSTAT SNOW SURGICEL 2X4 (HEMOSTASIS) ×3 IMPLANT
INST SET LAPROSCOPIC AP (KITS) ×3 IMPLANT
IV NS IRRIG 3000ML ARTHROMATIC (IV SOLUTION) ×3 IMPLANT
KIT TURNOVER KIT A (KITS) ×3 IMPLANT
MANIFOLD NEPTUNE II (INSTRUMENTS) ×3 IMPLANT
NEEDLE INSUFFLATION 14GA 120MM (NEEDLE) ×3 IMPLANT
NS IRRIG 1000ML POUR BTL (IV SOLUTION) ×3 IMPLANT
PACK LAP CHOLE LZT030E (CUSTOM PROCEDURE TRAY) ×3 IMPLANT
PAD ARMBOARD 7.5X6 YLW CONV (MISCELLANEOUS) ×3 IMPLANT
SET BASIN LINEN APH (SET/KITS/TRAYS/PACK) ×3 IMPLANT
SET TUBE IRRIG SUCTION NO TIP (IRRIGATION / IRRIGATOR) ×3 IMPLANT
SET TUBE SMOKE EVAC HIGH FLOW (TUBING) ×3 IMPLANT
SLEEVE ENDOPATH XCEL 5M (ENDOMECHANICALS) ×3 IMPLANT
SUT MNCRL AB 4-0 PS2 18 (SUTURE) ×9 IMPLANT
SUT VICRYL 0 UR6 27IN ABS (SUTURE) ×3 IMPLANT
SYS BAG RETRIEVAL 10MM (BASKET) ×1
SYSTEM BAG RETRIEVAL 10MM (BASKET) ×1 IMPLANT
TROCAR ENDO BLADELESS 11MM (ENDOMECHANICALS) ×3 IMPLANT
TROCAR XCEL NON-BLD 5MMX100MML (ENDOMECHANICALS) ×3 IMPLANT
TROCAR XCEL UNIV SLVE 11M 100M (ENDOMECHANICALS) ×3 IMPLANT
TUBE CONNECTING 12'X1/4 (SUCTIONS) ×1
TUBE CONNECTING 12X1/4 (SUCTIONS) ×2 IMPLANT
WARMER LAPAROSCOPE (MISCELLANEOUS) ×3 IMPLANT

## 2020-01-01 NOTE — Discharge Instructions (Signed)
Discharge Laparoscopic Surgery Instructions:  Common Complaints: Right shoulder pain is common after laparoscopic surgery. This is secondary to the gas used in the surgery being trapped under the diaphragm.  Walk to help your body absorb the gas. This will improve in a few days. Pain at the port sites are common, especially the larger port sites. This will improve with time.  Some nausea is common and poor appetite. The main goal is to stay hydrated the first few days after surgery.   Diet/ Activity: Diet as tolerated. You may not have an appetite, but it is important to stay hydrated. Drink 64 ounces of water a day. Your appetite will return with time.  Shower per your regular routine daily.  Do not take hot showers. Take warm showers that are less than 10 minutes. Rest and listen to your body, but do not remain in bed all day.  Walk everyday for at least 15-20 minutes. Deep cough and move around every 1-2 hours in the first few days after surgery.  Do not lift > 10 lbs, perform excessive bending, pushing, pulling, squatting for 1-2 weeks after surgery.  Do not pick at the dermabond glue on your incision sites.  This glue film will remain in place for 1-2 weeks and will start to peel off.  Do not place lotions or balms on your incision unless instructed to specifically by Dr. Bridges.   Pain Expectations and Narcotics: -After surgery you will have pain associated with your incisions and this is normal. The pain is muscular and nerve pain, and will get better with time. -You are encouraged and expected to take non narcotic medications like tylenol and ibuprofen (when able) to treat pain as multiple modalities can aid with pain treatment. -Narcotics are only used when pain is severe or there is breakthrough pain. -You are not expected to have a pain score of 0 after surgery, as we cannot prevent pain. A pain score of 3-4 that allows you to be functional, move, walk, and tolerate some activity is  the goal. The pain will continue to improve over the days after surgery and is dependent on your surgery. -Due to [place] law, we are only able to give a certain amount of pain medication to treat post operative pain, and we only give additional narcotics on a patient by patient basis.  -For most laparoscopic surgery, studies have shown that the majority of patients only need 10-15 narcotic pills, and for open surgeries most patients only need 15-20.   -Having appropriate expectations of pain and knowledge of pain management with non narcotics is important as we do not want anyone to become addicted to narcotic pain medication.  -Using ice packs in the first 48 hours and heating pads after 48 hours, wearing an abdominal binder (when recommended), and using over the counter medications are all ways to help with pain management.   -Simple acts like meditation and mindfulness practices after surgery can also help with pain control and research has proven the benefit of these practices.  Medication: Take tylenol and ibuprofen as needed for pain control, alternating every 4-6 hours.  Example:  Tylenol 1000mg @ 6am, 12noon, 6pm, 12midnight (Do not exceed 4000mg of tylenol a day). Ibuprofen 800mg @ 9am, 3pm, 9pm, 3am (Do not exceed 3600mg of ibuprofen a day).  Take Roxicodone for breakthrough pain every 4 hours.  Take Colace for constipation related to narcotic pain medication. If you do not have a bowel movement in 2 days, take Miralax   over the counter.  Drink plenty of water to also prevent constipation.   Contact Information: If you have questions or concerns, please call our office, [phone], Monday- Thursday 8AM-5PM and Friday 8AM-12Noon.  If it is after hours or on the weekend, please call [facility] Main Number, [phone], and ask to speak to the surgeon on call for Dr. Bridges at [place].    Laparoscopic Cholecystectomy, Care After This sheet gives you information about how to care for  yourself after your procedure. Your health care provider may also give you more specific instructions. If you have problems or questions, contact your health care provider. What can I expect after the procedure? After the procedure, it is common to have:  Pain at your incision sites. You will be given medicines to control this pain.  Mild nausea or vomiting.  Bloating and possible shoulder pain from the air-like gas that was used during the procedure. Follow these instructions at home: Incision care   Follow instructions from your health care provider about how to take care of your incisions. Make sure you: ? Wash your hands with soap and water before you change your bandage (dressing). If soap and water are not available, use hand sanitizer. ? Change your dressing as told by your health care provider. ? Leave stitches (sutures), skin glue, or adhesive strips in place. These skin closures may need to be in place for 2 weeks or longer. If adhesive strip edges start to loosen and curl up, you may trim the loose edges. Do not remove adhesive strips completely unless your health care provider tells you to do that.  Do not take baths, swim, or use a hot tub until your health care provider approves.   You may shower.  Check your incision area every day for signs of infection. Check for: ? More redness, swelling, or pain. ? More fluid or blood. ? Warmth. ? Pus or a bad smell. Activity  Do not drive or use heavy machinery while taking prescription pain medicine.  Do not lift anything that is heavier than 10 lb (4.5 kg) until your health care provider approves.  Do not play contact sports until your health care provider approves.  Do not drive for 24 hours if you were given a medicine to help you relax (sedative).  Rest as needed. Do not return to work or school until your health care provider approves. General instructions  Take over-the-counter and prescription medicines only as told  by your health care provider.  To prevent or treat constipation while you are taking prescription pain medicine, your health care provider may recommend that you: ? Drink enough fluid to keep your urine clear or pale yellow. ? Take over-the-counter or prescription medicines. ? Eat foods that are high in fiber, such as fresh fruits and vegetables, whole grains, and beans. ? Limit foods that are high in fat and processed sugars, such as fried and sweet foods. Contact a health care provider if:  You develop a rash.  You have more redness, swelling, or pain around your incisions.  You have more fluid or blood coming from your incisions.  Your incisions feel warm to the touch.  You have pus or a bad smell coming from your incisions.  You have a fever.  One or more of your incisions breaks open. Get help right away if:  You have trouble breathing.  You have chest pain.  You have increasing pain in your shoulders.  You faint or feel dizzy when   you stand.  You have severe pain in your abdomen.  You have nausea or vomiting that lasts for more than one day.  You have leg pain. This information is not intended to replace advice given to you by your health care provider. Make sure you discuss any questions you have with your health care provider. Document Revised: 08/24/2017 Document Reviewed: 02/28/2016 Elsevier Patient Education  2020 Elsevier Inc.   

## 2020-01-01 NOTE — Op Note (Signed)
Operative Note   Preoperative Diagnosis: Gallbladder sludge    Postoperative Diagnosis: Same   Procedure(s) Performed: Laparoscopic cholecystectomy   Surgeon: Ria Comment C. Constance Haw, MD   Assistants: No qualified resident was available   Anesthesia: General endotracheal   Anesthesiologist: Denese Killings, MD    Specimens: Gallbladder    Estimated Blood Loss: Minimal    Blood Replacement: None    Complications: None    Operative Findings: distended gallbladder    Procedure: The patient was taken to the operating room and placed supine. General endotracheal anesthesia was induced. Intravenous antibiotics were administered per protocol. An orogastric tube positioned to decompress the stomach. The abdomen was prepared and draped in the usual sterile fashion.    A supraumbilical incision was made and a Veress technique was utilized to achieve pneumoperitoneum to 15 mmHg with carbon dioxide. A 11 mm optiview port was placed through the supraumbilical region, and a 10 mm 0-degree operative laparoscope was introduced. The area underlying the trocar and Veress needle were inspected and without evidence of injury.  Remaining trocars were placed under direct vision. Two 5 mm ports were placed in the right abdomen, between the anterior axillary and midclavicular line.  A final 11 mm port was placed through the mid-epigastrium, near the falciform ligament.    The gallbladder fundus was elevated cephalad and the infundibulum was retracted to the patient's right. The gallbladder/cystic duct junction was skeletonized. The cystic artery noted in the triangle of Calot and was also skeletonized.  We then continued liberal medial and lateral dissection until the critical view of safety was achieved.    The cystic duct was triply clipped and cystic artery was doubly clipped and both were divided. The gallbladder was then dissected from the liver bed with electrocautery. The specimen was placed in an  Endopouch and was retrieved through the epigastric site.   Final inspection revealed acceptable hemostasis. Surgical SNOW was placed in the gallbladder bed.  Trocars were removed and pneumoperitoneum was released.  The epigastric and umbilical port sites were smaller than my finger at the fascia. Skin incisions were closed with 4-0 Monocryl subcuticular sutures and Dermabond. The patient was awakened from anesthesia and extubated without complication.    Curlene Labrum, MD [facility] [address] [place], [place] [address] [phone] (office)

## 2020-01-01 NOTE — Transfer of Care (Signed)
Immediate Anesthesia Transfer of Care Note  Patient: Erin Klein  Procedure(s) Performed: LAPAROSCOPIC CHOLECYSTECTOMY (N/A Abdomen)  Patient Location: PACU  Anesthesia Type:General  Level of Consciousness: awake, alert , oriented and patient cooperative  Airway & Oxygen Therapy: Patient Spontanous Breathing and Patient connected to nasal cannula oxygen  Post-op Assessment: Report given to RN and Post -op Vital signs reviewed and stable  Post vital signs: Reviewed and stable  Last Vitals:  Vitals Value Taken Time  BP 139/80 01/01/20 1415  Temp 36.8 C 01/01/20 1415  Pulse 87 01/01/20 1418  Resp 12 01/01/20 1418  SpO2 100 % 01/01/20 1418  Vitals shown include unvalidated device data.  Last Pain:  Vitals:   01/01/20 1122  TempSrc: Oral  PainSc: 0-No pain      Patients Stated Pain Goal: 5 ([id number])  Complications: No apparent anesthesia complications

## 2020-01-01 NOTE — Anesthesia Postprocedure Evaluation (Signed)
Anesthesia Post Note  Patient: Location manager  Procedure(s) Performed: LAPAROSCOPIC CHOLECYSTECTOMY (N/A Abdomen)  Patient location during evaluation: PACU Anesthesia Type: General Level of consciousness: awake and alert, oriented and patient cooperative Pain management: pain level controlled Vital Signs Assessment: vitals unstable and post-procedure vital signs reviewed and stable Respiratory status: spontaneous breathing, respiratory function stable, nonlabored ventilation and patient connected to nasal cannula oxygen Cardiovascular status: blood pressure returned to baseline and stable Postop Assessment: no apparent nausea or vomiting Anesthetic complications: no     Last Vitals:  Vitals:   01/01/20 1122 01/01/20 1415  BP: (!) 150/99 139/80  Pulse: (!) 104 100  Resp: (!) 22 12  Temp: 37.6 C 36.8 C  SpO2: 97% 100%    Last Pain:  Vitals:   01/01/20 1415  TempSrc:   PainSc: (P) 5                  Tashari Schoenfelder

## 2020-01-01 NOTE — Anesthesia Preprocedure Evaluation (Addendum)
Anesthesia Evaluation  Patient identified by MRN, date of birth, ID band Patient awake    Reviewed: Allergy & Precautions, NPO status , Patient's Chart, lab work & pertinent test results, reviewed documented beta blocker date and time   Airway Mallampati: II  TM Distance: >3 FB Neck ROM: Full    Dental  (+) Teeth Intact, Dental Advisory Given   Pulmonary neg pulmonary ROS,    Pulmonary exam normal breath sounds clear to auscultation       Cardiovascular Exercise Tolerance: Good + Valvular Problems/Murmurs (takes propranolol for palpitaions, last dose - 1 week ago) MR  Rhythm:Regular Rate:Normal - Systolic murmurs Impressions:   - Normal LV wall thickness with LVEF 60-65% and normal diastolic  function. Grossly normal mitral valve with trivial mitral  regurgitation. Trivial tricuspid regurgitation.   EKG- NSR, Right axis deviation   Neuro/Psych negative psych ROS   GI/Hepatic Neg liver ROS, GERD  Medicated and Controlled,Cholecystitis    Endo/Other  negative endocrine ROS  Renal/GU negative Renal ROS     Musculoskeletal negative musculoskeletal ROS (+)   Abdominal   Peds  Hematology negative hematology ROS (+)   Anesthesia Other Findings   Reproductive/Obstetrics negative OB ROS                            Anesthesia Physical Anesthesia Plan  ASA: II  Anesthesia Plan: General   Post-op Pain Management:    Induction: Intravenous  PONV Risk Score and Plan: 4 or greater and Ondansetron, Dexamethasone, Midazolam and Scopolamine patch - Pre-op  Airway Management Planned: Oral ETT  Additional Equipment:   Intra-op Plan:   Post-operative Plan: Extubation in OR  Informed Consent: I have reviewed the patients History and Physical, chart, labs and discussed the procedure including the risks, benefits and alternatives for the proposed anesthesia with the patient or authorized  representative who has indicated his/her understanding and acceptance.     Dental advisory given  Plan Discussed with: CRNA  Anesthesia Plan Comments:         Anesthesia Quick Evaluation

## 2020-01-01 NOTE — Interval H&P Note (Signed)
History and Physical Interval Note:  01/01/2020 12:40 PM  Erin Klein  has presented today for surgery, with the diagnosis of Gallbladder sludge.  The various methods of treatment have been discussed with the patient and family. After consideration of risks, benefits and other options for treatment, the patient has consented to  Procedure(s): LAPAROSCOPIC CHOLECYSTECTOMY (N/A) as a surgical intervention.  The patient's history has been reviewed, patient examined, no change in status, stable for surgery.  I have reviewed the patient's chart and labs.  Questions were answered to the patient's satisfaction.    No changes.   Virl Cagey

## 2020-01-01 NOTE — Progress Notes (Signed)
[place], [place], [phone], and that surgery is completed. Rx sent to [facility] on [address].  Curlene Labrum, MD [facility] [address] [place], [place] [address] [phone] (office)

## 2020-01-05 LAB — SURGICAL PATHOLOGY

## 2020-01-06 ENCOUNTER — Telehealth: Payer: Self-pay

## 2020-01-06 NOTE — Telephone Encounter (Signed)
Left message to return call to office. Patient stated on nurse line-complaints of nausea and diarrhea.

## 2020-01-07 NOTE — Telephone Encounter (Signed)
Patient stated she has had some nausea and diarrhea. She states she is feeling a little better. Still a little sore. Denies fever, chills. Incision sites look good. Be sure to stay hydrated. She feels the diarrhea is improving. She was reminded of virtual appointment 01/20/20 @ 11:45 am.

## 2020-01-08 ENCOUNTER — Telehealth: Payer: Self-pay

## 2020-01-08 NOTE — Telephone Encounter (Signed)
Patient denies fever, chill,nausea or vomiting. No redness. Will call back if any signs of infection. Bright redness, fever, pain.

## 2020-01-08 NOTE — Telephone Encounter (Signed)
Left message for patient to return call.

## 2020-01-20 ENCOUNTER — Telehealth (INDEPENDENT_AMBULATORY_CARE_PROVIDER_SITE_OTHER): Payer: Self-pay | Admitting: General Surgery

## 2020-01-20 DIAGNOSIS — K828 Other specified diseases of gallbladder: Secondary | ICD-10-CM

## 2020-01-20 NOTE — Progress Notes (Signed)
[facility]  I am calling the patient for post operative evaluation due to the current COVID 19 pandemic.  The patient had a laparoscopic cholecystectomy on 01/01/2020.   Left her a message. Told patient to call if she is having issues. Activity and diet as tolerated.   Will see the patient PRN.   Pathology: A. 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   Curlene Labrum, MD [facility] [address] [place], [place] [address] [phone] (office)

## 2020-01-21 ENCOUNTER — Telehealth (INDEPENDENT_AMBULATORY_CARE_PROVIDER_SITE_OTHER): Payer: Self-pay | Admitting: General Surgery

## 2020-01-21 DIAGNOSIS — Z09 Encounter for follow-up examination after completed treatment for conditions other than malignant neoplasm: Secondary | ICD-10-CM

## 2020-01-21 NOTE — Telephone Encounter (Signed)
[facility]  Patient was unable to answer yesterday. Called the office today. Glue is pulling off some.  She has some bruising around the sites.  Says overall doing well.   She says her recovery process was hard due to the sedation.   Activity and diet as tolerated.    Curlene Labrum, MD [facility] [address] [place], [place] [address] [phone] (office)

## 2020-07-05 ENCOUNTER — Emergency Department (HOSPITAL_COMMUNITY): Payer: BC Managed Care – PPO

## 2020-07-05 ENCOUNTER — Other Ambulatory Visit: Payer: Self-pay

## 2020-07-05 ENCOUNTER — Emergency Department (HOSPITAL_COMMUNITY)
Admission: EM | Admit: 2020-07-05 | Discharge: 2020-07-05 | Disposition: A | Discharge status: Home / Self Care | Payer: BC Managed Care – PPO | Attending: Emergency Medicine | Admitting: Emergency Medicine

## 2020-07-05 ENCOUNTER — Encounter (HOSPITAL_COMMUNITY): Payer: Self-pay

## 2020-07-05 DIAGNOSIS — R002 Palpitations: Secondary | ICD-10-CM | POA: Diagnosis not present

## 2020-07-05 DIAGNOSIS — G501 Atypical facial pain: Secondary | ICD-10-CM | POA: Insufficient documentation

## 2020-07-05 DIAGNOSIS — S069X0A Unspecified intracranial injury without loss of consciousness, initial encounter: Secondary | ICD-10-CM | POA: Diagnosis not present

## 2020-07-05 DIAGNOSIS — S0990XA Unspecified injury of head, initial encounter: Secondary | ICD-10-CM

## 2020-07-05 DIAGNOSIS — R519 Headache, unspecified: Secondary | ICD-10-CM | POA: Insufficient documentation

## 2020-07-05 DIAGNOSIS — M25512 Pain in left shoulder: Secondary | ICD-10-CM | POA: Insufficient documentation

## 2020-07-05 DIAGNOSIS — S199XXA Unspecified injury of neck, initial encounter: Secondary | ICD-10-CM | POA: Diagnosis present

## 2020-07-05 DIAGNOSIS — R079 Chest pain, unspecified: Secondary | ICD-10-CM | POA: Insufficient documentation

## 2020-07-05 DIAGNOSIS — S161XXA Strain of muscle, fascia and tendon at neck level, initial encounter: Secondary | ICD-10-CM | POA: Insufficient documentation

## 2020-07-05 DIAGNOSIS — Y92414 Local residential or business street as the place of occurrence of the external cause: Secondary | ICD-10-CM | POA: Insufficient documentation

## 2020-07-05 MED ORDER — METHOCARBAMOL 500 MG PO TABS: 500.0000 mg | ORAL_TABLET | Freq: Three times a day (TID) | ORAL | 0 refills | Status: DC

## 2020-07-05 MED ORDER — ONDANSETRON 8 MG PO TBDP
8.0000 mg | ORAL_TABLET | Freq: Once | ORAL | Status: AC
  Administered 2020-07-05: 8 mg via ORAL
  Filled 2020-07-05: qty 1

## 2020-07-05 MED ORDER — HYDROCODONE-ACETAMINOPHEN 5-325 MG PO TABS
1.0000 | ORAL_TABLET | Freq: Once | ORAL | Status: AC
  Administered 2020-07-05: 1 via ORAL
  Filled 2020-07-05: qty 1

## 2020-07-05 MED ORDER — HYDROCODONE-ACETAMINOPHEN 5-325 MG PO TABS
1.0000 | ORAL_TABLET | Freq: Once | ORAL | Status: DC
  Filled 2020-07-05: qty 1

## 2020-07-05 MED ORDER — HYDROCODONE-ACETAMINOPHEN 5-325 MG PO TABS: ORAL_TABLET | ORAL | 0 refills | Status: DC
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
## 2020-07-05 NOTE — ED Notes (Signed)
Pt transported to CT ?

## 2020-08-18 ENCOUNTER — Encounter: Payer: Self-pay | Admitting: Orthopedic Surgery

## 2020-09-14 ENCOUNTER — Other Ambulatory Visit: Payer: Self-pay

## 2020-09-14 ENCOUNTER — Other Ambulatory Visit: Payer: BC Managed Care – PPO

## 2020-09-14 DIAGNOSIS — Z20822 Contact with and (suspected) exposure to covid-19: Secondary | ICD-10-CM

## 2020-09-16 LAB — SPECIMEN STATUS REPORT

## 2020-09-16 LAB — SARS-COV-2, NAA 2 DAY TAT

## 2020-09-16 LAB — NOVEL CORONAVIRUS, NAA: SARS-CoV-2, NAA: NOT DETECTED

## 2020-10-22 ENCOUNTER — Other Ambulatory Visit: Payer: BC Managed Care – PPO

## 2021-05-17 ENCOUNTER — Telehealth: Payer: BC Managed Care – PPO | Admitting: *Deleted

## 2021-05-17 NOTE — Telephone Encounter (Signed)
Patient was last seen in 2018. She called today because she was taking Minocycline for Acne and stated that she started to experience dizziness and heart palpitations so she stopped the antibiotic. Her physicians office is wanting to switch her to Bactrim but she is hesitant now that she may have a reaction. To her knowledge she has not had an issue with other sulfa antibiotics in the past and was wondering if we ever tested her for this. I did advise that she was not tested for antibiotics. Will you please provide some recommendation going forward for the patient to start Bactrim, thank you so much.

## 2021-05-18 NOTE — Telephone Encounter (Signed)
The only antibiotic we have validated testing for his penicillin.  If she was worried about a reaction to Bactrim, we would have her do an observed challenge in the office.  There is no testing at all for Bactrim and if she finds a place that does do testing, there is absolutely no data to support it. It is all a money thing.   So history taking and the challenge are the best ways to diagnose this.   Salvatore Marvel, MD [facility] [place] [facility]

## 2021-05-19 NOTE — Telephone Encounter (Signed)
Patient called back for an update.  Please Advise

## 2021-05-19 NOTE — Telephone Encounter (Signed)
Patient called back to see if a nurse could speak to her. Unfortunately all of our nurses were unavailable. Patient then asked if there was anyway she could come in today or tomorrow. I did let patient know that unfortunately since its been more than 3 years since she has been seen she is now considered as a new patient. I did let patient know we are booked out until October for new patient appointments. Patient verbalized understanding and asked if a nurse could return her call.

## 2021-07-01 ENCOUNTER — Encounter: Payer: Self-pay | Admitting: Allergy & Immunology

## 2021-07-01 ENCOUNTER — Ambulatory Visit: Payer: BC Managed Care – PPO | Admitting: Allergy & Immunology

## 2021-07-01 ENCOUNTER — Telehealth: Payer: Self-pay | Admitting: *Deleted

## 2021-07-01 ENCOUNTER — Other Ambulatory Visit: Payer: Self-pay

## 2021-07-01 VITALS — BP 132/86 | HR 80 | Temp 98.1°F | Resp 18 | Ht 69.0 in | Wt 191.6 lb

## 2021-07-01 DIAGNOSIS — J302 Other seasonal allergic rhinitis: Secondary | ICD-10-CM | POA: Diagnosis not present

## 2021-07-01 DIAGNOSIS — K219 Gastro-esophageal reflux disease without esophagitis: Secondary | ICD-10-CM | POA: Diagnosis not present

## 2021-07-01 DIAGNOSIS — J3089 Other allergic rhinitis: Secondary | ICD-10-CM

## 2021-07-01 DIAGNOSIS — R21 Rash and other nonspecific skin eruption: Secondary | ICD-10-CM

## 2021-07-01 MED ORDER — FAMOTIDINE 40 MG PO TABS: 40.0000 mg | ORAL_TABLET | Freq: Every evening | ORAL | 5 refills | Status: DC

## 2021-07-01 MED ORDER — EUCRISA 2 % EX OINT: TOPICAL_OINTMENT | CUTANEOUS | 2 refills | Status: DC

## 2021-07-01 NOTE — Addendum Note (Signed)
Addended by: Hedy Camara L on: 07/01/2021 12:00 PM   Modules accepted: Orders

## 2021-07-01 NOTE — Patient Instructions (Addendum)
1. Gastroesophageal reflux disease - with cough - Start Pepcid 40mg  at night (we can increase to twice daily if there is not enough improvement in the coughing).  - The stronger medications (known as proton pump inhibitors) can be associated with decreased bone demineralization, so I would prefer to avoid those). - Give [place] an update in a couple of weeks.   2. Seasonal and perennial allergic rhinitis (grasses, weeds, trees, molds, dust mite, cat, and cockroach) - It seems that you are not really having any symptoms of allergic rhinitis. - I do not think that we need to do anything with regards to treating this.   3. Rash - I am unsure of the cause of the rash.  - All of the foods were negative, which rules out a lot of food allergies.  - Copy of testing results provided. - I am going to give you samples of Eucrisa (a topical anti inflammatory that is NOT a steroid). - It can help with the itching sensation and might help clear this up. - This could certainly be a [id number] thing and hopefully will clear up over time. - I do NOT think that this is related to your antibiotics you are on for your acne.  - Add on Zyrtec (cetirizine) 10mg  daily. - We can get some labs to rule out weird causes of hives/rashes (autoimmunity, etc).  - We will call you in 1-2 weeks with the results of the testing.   4. Return in about 6 months (around 12/30/2021).    Please inform us of any Emergency Department visits, hospitalizations, or changes in symptoms. Call us before going to the ED for breathing or allergy symptoms since we might be able to fit you in for a sick visit. Feel free to contact us anytime with any questions, problems, or concerns.  It was a pleasure to meet you today!  Websites that have reliable patient information: 1. American Academy of Asthma, Allergy, and Immunology: www.aaaai.org 2. Food Allergy Research and Education (FARE): foodallergy.org 3. Mothers of Asthmatics:  http://www.asthmacommunitynetwork.org 4. American College of Allergy, Asthma, and Immunology: www.acaai.org   COVID-19 Vaccine Information can be found at: [email or web address].[email or web address].uk For questions related to vaccine distribution or appointments, please email [email or web address] or call [phone].   We realize that you might be concerned about having an allergic reaction to the COVID19 vaccines. To help with that concern, WE ARE OFFERING THE COVID19 VACCINES IN OUR OFFICE! Ask the front desk for dates!     "Like" [place] on Facebook and Instagram for our latest updates!      A healthy democracy works best when [employer] participate! Make sure you are registered to vote! If you have moved or changed any of your contact information, you will need to get this updated before voting!  In some cases, you MAY be able to register to vote online: [email or web address]    1. Peanut Negative   2. Soybean Negative   3. Wheat Negative   4. Sesame Negative   5. Milk, cow Negative   6. Egg White, Chicken Negative   7. Casein Negative   8. Shellfish Mix Negative   9. Fish Mix Negative   10. Cashew Negative   11. Pecan Food Negative   12. [place] Negative   13. Almond Negative   14. Hazelnut Negative   15. [place] nut Negative   16. Coconut Negative   17. Pistachio Negative   18. Catfish Negative   19. Bass Negative   20. Trout  Negative   21. Tuna Negative   22. Salmon Negative   23. Flounder Negative   24. Codfish Negative   25. Shrimp Negative   26. Crab Negative   27. Lobster Negative   28. Oyster Negative   29. Scallops Negative   30. Barley Negative   31. Oat  Negative   32. Rye  Negative   33. Hops Negative   34. Rice Negative   35. Cottonseed Negative   36. Saccharomyces Cerevisiae  Negative   37. Pork Negative   38. [place] Meat Negative   39. Chicken Meat Negative   40. Beef Negative    41. Lamb Negative   42. Tomato Negative   43. White Potato Negative   44. Sweet Potato Negative   45. Pea, Green/English Negative   46. Navy Bean Negative   47. Mushrooms Negative   48. Avocado Negative   49. Onion Negative   50. Cabbage Negative   51. Carrots Negative   52. Celery Negative   53. Corn Negative   54. Cucumber Negative   55. Grape (White seedless) Negative   56. Orange  Negative   57. Banana Negative   58. Apple Negative   59. Peach Negative   60. Strawberry Negative   61. Cantaloupe Negative   62. Watermelon Negative   63. Pineapple Negative   64. Chocolate/Cacao bean Negative   65. Karaya Gum Negative   66. Acacia (Arabic Gum) Negative   67. Cinnamon Negative   68. Nutmeg Negative   69. Ginger Negative   70. Garlic Negative   71. Pepper, black Negative   72. Mustard Negative

## 2021-07-01 NOTE — Telephone Encounter (Signed)
Received fax from pharmacy stating that Erin Klein was not covered but that preferred alternatives are Metamethasone, Clobetasol, Fluocinolone, Halobetasol, Mometasone, and Triamcinolone. Please advised change in medication.

## 2021-07-01 NOTE — Progress Notes (Signed)
NEW PATIENT  Date of Service/Encounter:  07/01/21  Consult requested by: Redmond School, MD   Assessment:   Perennial and seasonal allergic rhinitis (grasses, weeds, trees, molds, dust mite, cat, and cockroach)  Contact dermatitis (potassium dichromate, tixocortol-21-pivalate, gold sodium thiosulfate, ethylenediamine dihydrochloride, cobalt dichloride, acnomel)  GERD  Rash - with negative testing to the the entire food panel   Plan/Recommendations:   1. Gastroesophageal reflux disease - with cough - Start Pepcid 40mg  at night (we can increase to twice daily if there is not enough improvement in the coughing).  - The stronger medications (known as proton pump inhibitors) can be associated with decreased bone demineralization, so I would prefer to avoid those). - Give [place] an update in a couple of weeks.   2. Seasonal and perennial allergic rhinitis (grasses, weeds, trees, molds, dust mite, cat, and cockroach) - It seems that you are not really having any symptoms of allergic rhinitis. - I do not think that we need to do anything with regards to treating this.   3. Rash - I am unsure of the cause of the rash.  - All of the foods were negative, which rules out a lot of food allergies.  - Copy of testing results provided. - I am going to give you samples of Eucrisa (a topical anti inflammatory that is NOT a steroid). - It can help with the itching sensation and might help clear this up. - This could certainly be a [id number] thing and hopefully will clear up over time. - I do NOT think that this is related to your antibiotics you are on for your acne.  - Add on Zyrtec (cetirizine) 10mg  daily. - We can get some labs to rule out weird causes of hives/rashes (autoimmunity, etc).  - We will call you in 1-2 weeks with the results of the testing.   4. Return in about 6 months (around 12/30/2021).    This note in its entirety was forwarded to the Provider who requested this  consultation.  Subjective:   Erin Klein is a 45 y.o. female presenting today for evaluation of  Chief Complaint  Patient presents with   Rash    Randomly on chest and neck - has been going to the dermatologist and has been using new products. The dermatologist prescribed September 12th prescription was a compound of          June prescription tretinoin cream 0.025% for wrinkles and acne.    Other    August 2nd she had covid and it started right afterwards.    Allergy Testing    [place] she felt something in her throat, would like to test for 1-69 and fruits     Erin Klein has a history of the following: Patient Active Problem List   Diagnosis Date Noted   Gallbladder sludge 12/30/2019   Contact dermatitis due to chemicals 09/12/2017   Epistaxis 09/12/2017    History obtained from: chart review and patient.  [name] was referred by Redmond School, MD.     Erin Klein is a 45 y.o. female presenting for an evaluation of an allergic reaction .  She was actually seen by our practice previously back in 2018.  Initially, she was Dr. Nelva Bush.  This was for an allergic reaction.  She had testing that was positive to grasses, weeds, trees, molds, dust mite, cat, and cockroach.  She had selective food testing which was negative.  She felt that the rash was consistent with hives combined with  a component of contact dermatitis.  However, she ended up getting patch testing done.  In the interim, she has done very well. She did great and has been managing everything fine.  However, within the last 2 months, she had rash over her neck and top of her chest. She is seen by [facility] [employer] with Dr. Nevada Crane (Dermatologist). She did go back and see Amber and she did not feel that this was related to her medication. She is now on Bactrim for acne. She was previously on doxycycline and minocycline. However, the rash has been going on before the Bactrim started. She has been taking  pictures of the rash. It started as a splotch on her side. Then it spread to the creases.   She did catch COVID19 in early August and the rash started after this. The rash is itchy. She is now concerned that there is something that she is eating. She will have worsening flares after she eats. It is not a particular food, but fruit can trigger it (any fruit really).   She was prescribed a topical steroid, seems that it was a compounded ointment at [employer]. It does make the rash feel good, but it keeps coming back. This was the most recent topical medication. She has used Benadryl with some improvement sometimes. She has tried using calamine lotion without much improvement.   Continues to avoid all of her triggering chemicals.  She was wearing a necklace around the same time that the rash started, but she took it off immediately and has not worn any jewelry since that time.   GERD Symptom History: She does have a history of reflux. She reports a chronic cough even when she was on the antibiotics. This started before [place]. This was around May 2022 where the cough started.  This past week, she started feeling legitimate acid reflux with pressure/burning in her throat. She woke up with intense heart burn pain and took a Pepcid and took an hour to work. She was sitting there in pain.   Otherwise, there is no history of other atopic diseases, including asthma, food allergies, drug allergies, stinging insect allergies, or contact dermatitis. There is no significant infectious history. Vaccinations are up to date.    Past Medical History: Patient Active Problem List   Diagnosis Date Noted   Gallbladder sludge 12/30/2019   Contact dermatitis due to chemicals 09/12/2017   Epistaxis 09/12/2017    Medication List:  Allergies as of 07/01/2021       Reactions   Morphine And Related Other (See Comments)   Pt states she has been given morphine in the past and thinks it was given to fast. She has had it  since then and done fine with it        Medication List        Accurate as of July 01, 2021  9:31 AM. If you have any questions, ask your nurse or doctor.          cetirizine 10 MG tablet Commonly known as: ZYRTEC Take 10 mg by mouth daily as needed for allergies.   ELDERBERRY PO Take 2 tablets by mouth daily. Gummies   HYDROcodone-acetaminophen 5-325 MG tablet Commonly known as: NORCO/VICODIN Take one tab po q 4 hrs prn pain   Lubricant Eye Drops 0.4-0.3 % Soln Generic drug: Polyethyl Glycol-Propyl Glycol Place 1 drop into both eyes 3 (three) times daily as needed (dry/irritated eyes.).   methocarbamol 500 MG tablet Commonly known  as: ROBAXIN Take 1 tablet (500 mg total) by mouth 3 (three) times daily.   multivitamin with minerals tablet Take 1 tablet by mouth daily.   naproxen 500 MG tablet Commonly known as: NAPROSYN Take 500 mg by mouth 2 (two) times daily as needed (Menstrual cramps).   ondansetron 4 MG disintegrating tablet Commonly known as: Zofran ODT 4mg  ODT q4 hours prn nausea/vomit   propranolol 10 MG tablet Commonly known as: INDERAL Take one tab by mouth daily as needed What changed:  how much to take how to take this when to take this reasons to take this additional instructions        Birth History: non-contributory  Developmental History: non-contributory  Past Surgical History: Past Surgical History:  Procedure Laterality Date   CHOLECYSTECTOMY N/A 01/01/2020   Procedure: LAPAROSCOPIC CHOLECYSTECTOMY;  Surgeon: Virl Cagey, MD;  Location: [facility];  Service: General;  Laterality: N/A;   Dermoid cyst resection     WISDOM TOOTH EXTRACTION       Family History: Family History  Problem Relation Age of Onset   Hypertension Mother    Hypertension Father    Hypertension Brother      Social History: Shallen lives at home with her family.  In a house that is around 44 years old.  There is a dog at home.  She has central  cooling and heating.  There are 3 dogs inside of the home.  Dust mite covers on the bedding.  There is no tobacco exposure.  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  She is exposed to fumes, chemicals, and dust.  She does use a HEPA filter in her home.   Review of Systems  Constitutional: Negative.  Negative for chills, fever, malaise/fatigue and weight loss.  HENT: Negative.  Negative for congestion, ear discharge and ear pain.   Eyes:  Negative for pain, discharge and redness.  Respiratory:  Negative for cough, sputum production, shortness of breath and wheezing.   Cardiovascular: Negative.  Negative for chest pain and palpitations.  Gastrointestinal:  Negative for abdominal pain, constipation, diarrhea, heartburn, nausea and vomiting.  Skin:  Positive for itching and rash.  Neurological:  Negative for dizziness and headaches.  Endo/Heme/Allergies:  Negative for environmental allergies. Does not bruise/bleed easily.      Objective:   Blood pressure 132/86, pulse 80, temperature 98.1 F (36.7 C), resp. rate 18, height 5\' 9"  (1.753 m), weight 191 lb 9.6 oz (86.9 kg), SpO2 98 %. Body mass index is 28.29 kg/m.   Physical Exam:   Physical Exam Vitals reviewed.  Constitutional:      Appearance: She is well-developed.     Comments: Very talkative.  HENT:     Head: Normocephalic and atraumatic.     Right Ear: Tympanic membrane, ear canal and external ear normal. No drainage, swelling or tenderness. Tympanic membrane is not injected, scarred, erythematous, retracted or bulging.     Left Ear: Tympanic membrane, ear canal and external ear normal. No drainage, swelling or tenderness. Tympanic membrane is not injected, scarred, erythematous, retracted or bulging.     Nose: No nasal deformity, septal deviation, mucosal edema or rhinorrhea.     Right Sinus: No maxillary sinus tenderness or frontal sinus tenderness.     Left Sinus: No maxillary sinus tenderness or  frontal sinus tenderness.     Mouth/Throat:     Mouth: Mucous membranes are not pale and not dry.     Pharynx: Uvula  midline.  Eyes:     General:        Right eye: No discharge.        Left eye: No discharge.     Conjunctiva/sclera: Conjunctivae normal.     Right eye: Right conjunctiva is not injected. No chemosis.    Left eye: Left conjunctiva is not injected. No chemosis.    Pupils: Pupils are equal, round, and reactive to light.  Cardiovascular:     Rate and Rhythm: Normal rate and regular rhythm.     Heart sounds: Normal heart sounds.  Pulmonary:     Effort: Pulmonary effort is normal. No tachypnea, accessory muscle usage or respiratory distress.     Breath sounds: Normal breath sounds. No wheezing, rhonchi or rales.  Chest:     Chest wall: No tenderness.  Abdominal:     Tenderness: There is no abdominal tenderness. There is no guarding or rebound.  Lymphadenopathy:     Head:     Right side of head: No submandibular, tonsillar or occipital adenopathy.     Left side of head: No submandibular, tonsillar or occipital adenopathy.     Cervical: No cervical adenopathy.  Skin:    General: Skin is warm.     Capillary Refill: Capillary refill takes less than 2 seconds.     Coloration: Skin is not pale.     Findings: No abrasion, erythema, petechiae or rash. Rash is not papular, urticarial or vesicular.     Comments: She does have a flat erythematous blanchable rash extending from her chin down to her upper chest.  There are some excoriations present.  It is not raised. There is no honey crusting at all.   Neurological:     Mental Status: She is alert.  Psychiatric:        Behavior: Behavior is cooperative.     Diagnostic studies:   Allergy Studies:     Food Adult Perc - 07/01/21 0800     Time Antigen Placed 6269    Allergen Manufacturer Lavella Hammock    Location Back     Control-buffer 50% Glycerol Negative    Control-Histamine 1 mg/ml 2+   STRONG HISTAMINE   1. Peanut Negative     2. Soybean Negative    3. Wheat Negative    4. Sesame Negative    5. Milk, cow Negative    6. Egg White, Chicken Negative    7. Casein Negative    8. Shellfish Mix Negative    9. Fish Mix Negative    10. Cashew Negative    11. Pecan Food Negative    12. [place] Negative    13. Almond Negative    14. Hazelnut Negative    15. [place] nut Negative    16. Coconut Negative    17. Pistachio Negative    18. Catfish Negative    19. Bass Negative    20. Trout Negative    21. Tuna Negative    22. Salmon Negative    23. Flounder Negative    24. Codfish Negative    25. Shrimp Negative    26. Crab Negative    27. Lobster Negative    28. Oyster Negative    29. Scallops Negative    30. Barley Negative    31. Oat  Negative    32. Rye  Negative    33. Hops Negative    34. Rice Negative    35. Cottonseed Negative    36. Saccharomyces Cerevisiae  Negative    37. Pork Negative    38. [place] Meat Negative    39. Chicken Meat Negative    40. Beef Negative    41. Lamb Negative    42. Tomato Negative    43. White Potato Negative    44. Sweet Potato Negative    45. Pea, Green/English Negative    46. Navy Bean Negative    47. Mushrooms Negative    48. Avocado Negative    49. Onion Negative    50. Cabbage Negative    51. Carrots Negative    52. Celery Negative    53. Corn Negative    54. Cucumber Negative    55. Grape (White seedless) Negative    56. Orange  Negative    57. Banana Negative    58. Apple Negative    59. Peach Negative    60. Strawberry Negative    61. Cantaloupe Negative    62. Watermelon Negative    63. Pineapple Negative    64. Chocolate/Cacao bean Negative    65. Karaya Gum Negative    66. Acacia (Arabic Gum) Negative    67. Cinnamon Negative    68. Nutmeg Negative    69. Ginger Negative    70. Garlic Negative    71. Pepper, black Negative    72. Mustard Negative             Allergy testing results were read and interpreted by myself,  documented by clinical staff.         Salvatore Marvel, MD [facility]

## 2021-07-05 MED ORDER — OPZELURA 1.5 % EX CREA: 1.0000 "application " | TOPICAL_CREAM | Freq: Two times a day (BID) | CUTANEOUS | 5 refills | Status: DC | PRN

## 2021-07-05 NOTE — Telephone Encounter (Signed)
You can tell the insurance company that those are crappy "alternatives", as those listed are steroids (and not non-steroidals like [place]).  Let's just send in [place] to the specialty pharmacy that delivers it to her home (twice daily as needed).  Salvatore Marvel, MD [facility]

## 2021-07-05 NOTE — Telephone Encounter (Signed)
Left detailed message on pts voicemail of the change and sending in the opzelura to [facility] and that they would reach out to her to get it shipped

## 2021-07-05 NOTE — Addendum Note (Signed)
Addended by: Felipa Emory on: 07/05/2021 10:42 AM   Modules accepted: Orders

## 2021-07-05 NOTE — Telephone Encounter (Signed)
Sent in rx to [facility]

## 2021-07-08 LAB — ALLERGEN PROFILE, BASIC FOOD
Allergen Corn, IgE: 0.19 kU/L — AB
Beef IgE: 0.44 kU/L — AB
Chocolate/Cacao IgE: <0.1 kU/L
Egg, Whole IgE: <0.1 kU/L
Food Mix (Seafoods) IgE: NEGATIVE
Milk IgE: 2.01 kU/L — AB
Peanut IgE: 0.25 kU/L — AB
Pork IgE: <0.1 kU/L
Soybean IgE: 0.16 kU/L — AB
Wheat IgE: 0.27 kU/L — AB

## 2021-07-08 LAB — ALLERGEN PROFILE, FOOD-FRUIT
Allergen Apple, IgE: 0.33 kU/L — AB
Allergen Banana IgE: 0.22 kU/L — AB
Allergen Grape IgE: <0.1 kU/L
Allergen Pear IgE: 0.28 kU/L — AB
Allergen, Peach f95: 0.29 kU/L — AB

## 2021-07-08 LAB — ALPHA-GAL PANEL
Allergen Lamb IgE: 0.43 kU/L — AB
Beef IgE: 0.36 kU/L — AB
IgE (Immunoglobulin E), Serum: 240 IU/mL (ref 6–495)
O215-IgE Alpha-Gal: <0.1 kU/L
Pork IgE: <0.1 kU/L

## 2021-07-11 ENCOUNTER — Encounter: Payer: Self-pay | Admitting: Allergy & Immunology

## 2021-07-16 LAB — CMP14+EGFR
ALT: 13 IU/L (ref 0–32)
AST: 18 IU/L (ref 0–40)
Albumin/Globulin Ratio: 1.7 (ref 1.2–2.2)
Albumin: 4.6 g/dL (ref 3.8–4.8)
Alkaline Phosphatase: 33 IU/L — ABNORMAL LOW (ref 44–121)
BUN/Creatinine Ratio: 10 (ref 9–23)
BUN: 10 mg/dL (ref 6–24)
Bilirubin Total: 0.7 mg/dL (ref 0.0–1.2)
CO2: 19 mmol/L — ABNORMAL LOW (ref 20–29)
Calcium: 9.4 mg/dL (ref 8.7–10.2)
Chloride: 102 mmol/L (ref 96–106)
Creatinine, Ser: 0.96 mg/dL (ref 0.57–1.00)
Globulin, Total: 2.7 g/dL (ref 1.5–4.5)
Glucose: 88 mg/dL (ref 70–99)
Potassium: 4.4 mmol/L (ref 3.5–5.2)
Sodium: 136 mmol/L (ref 134–144)
Total Protein: 7.3 g/dL (ref 6.0–8.5)
eGFR: 75 mL/min/{1.73_m2} (ref >59)

## 2021-07-16 LAB — ANTINUCLEAR ANTIBODIES, IFA: ANA Titer 1: NEGATIVE

## 2021-07-16 LAB — C-REACTIVE PROTEIN: CRP: 2 mg/L (ref 0–10)

## 2021-07-16 LAB — TRYPTASE: Tryptase: 6.3 ug/L (ref 2.2–13.2)

## 2021-07-16 LAB — SEDIMENTATION RATE: Sed Rate: 4 mm/hr (ref 0–32)

## 2021-07-16 LAB — THYROID ANTIBODIES
Thyroglobulin Antibody: 3.9 IU/mL — ABNORMAL HIGH (ref 0.0–0.9)
Thyroperoxidase Ab SerPl-aCnc: 10 IU/mL (ref 0–34)

## 2021-07-16 LAB — CHRONIC URTICARIA: cu index: 3.8 (ref <10)

## 2021-07-21 ENCOUNTER — Ambulatory Visit: Payer: Self-pay | Admitting: Allergy
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
## 2022-08-10 ENCOUNTER — Ambulatory Visit: Payer: BC Managed Care – PPO | Attending: Cardiovascular Disease | Admitting: Cardiovascular Disease

## 2022-08-10 ENCOUNTER — Encounter: Payer: Self-pay | Admitting: Cardiovascular Disease

## 2022-08-10 VITALS — BP 138/98 | HR 80 | Ht 69.0 in | Wt 183.5 lb

## 2022-08-10 DIAGNOSIS — I059 Rheumatic mitral valve disease, unspecified: Secondary | ICD-10-CM

## 2022-08-10 DIAGNOSIS — I493 Ventricular premature depolarization: Secondary | ICD-10-CM | POA: Diagnosis not present

## 2022-08-10 DIAGNOSIS — R030 Elevated blood-pressure reading, without diagnosis of hypertension: Secondary | ICD-10-CM | POA: Diagnosis not present

## 2022-08-10 NOTE — Patient Instructions (Signed)
Medication Instructions:  No changes *If you need a refill on your cardiac medications before your next appointment, please call your pharmacy*   Lab Work: None ordered If you have labs (blood work) drawn today and your tests are completely normal, you will receive your results only by: [place] (if you have MyChart) OR A paper copy in the mail If you have any lab test that is abnormal or we need to change your treatment, we will call you to review the results.   Testing/Procedures: Your physician has requested that you have an echocardiogram. Echocardiography is a painless test that uses sound waves to create images of your heart. It provides your doctor with information about the size and shape of your heart and how well your heart's chambers and valves are working.   You may receive an ultrasound enhancing agent through an IV if needed to better visualize your heart during the echo. This procedure takes approximately one hour.  There are no restrictions for this procedure.  This will take place at [facility] [id number], [place]    Follow-Up: At [facility], you and your health needs are our priority.  As part of our continuing mission to provide you with exceptional heart care, we have created designated Provider Care Teams.  These Care Teams include your primary Cardiologist (physician) and Advanced Practice Providers (APPs -  Physician Assistants and Nurse Practitioners) who all work together to provide you with the care you need, when you need it.  We recommend signing up for the patient portal called "MyChart".  Sign up information is provided on this After Visit Summary.  MyChart is used to connect with patients for Virtual Visits (Telemedicine).  Patients are able to view lab/test results, encounter notes, upcoming appointments, etc.  Non-urgent messages can be sent to your provider as well.   To learn more about what you can  do with MyChart, go to [email or web address].    Your next appointment:   12 month(s)  The format for your next appointment:   In Person  Provider:   You may see Kathlyn Sacramento, MD or one of the following Advanced Practice Providers on your designated Care Team:   Murray Hodgkins, NP Christell Faith, PA-C Cadence Kathlen Mody, PA-C Gerrie Nordmann, NP    Important Information About Sugar

## 2022-08-10 NOTE — Progress Notes (Signed)
Cardiology Office Note   Date:  08/10/2022   ID:  Erin Klein, DOB [birth year], MRN [id number]  PCP:  Redmond School, MD  Cardiologist:   Kathlyn Sacramento, MD   Chief Complaint  Patient presents with   OTHER    Est care pt would like to discuss having echo scheduled. Meds reviewed verbally with pt.      History of Present Illness: Erin Klein is a 46 y.o. female who presents to establish cardiac floor care.  She moved to [place] from [place] in 2013.  She is to see a cardiologist there for palpitations and trivial mitral regurgitation.  In addition, she has left anterior fascicular block.  She works as a Teaching laboratory technician. 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  She was seen by Dr. Harrington Challenger in 2019 and by [facility] cardiology in 2022.  She has been doing well with no chest pain but describes mild dyspnea and continued mild palpitations.  She has propranolol to be used as needed but does not have to use it very often.  She is not a smoker and drinks occasional alcohol.  She drinks 1 to 2 cups of coffee daily.  There is no family history of sudden death, arrhythmia or premature coronary artery disease.  There is family history of hypertension.  Past Medical History:  Diagnosis Date   Contact dermatitis due to chemicals 09/12/2017   Dermoid cyst    Removed   Epistaxis 09/12/2017   Mitral regurgitation    Mild   Palpitations    Rare PVCs by cardiac monitor    Past Surgical History:  Procedure Laterality Date   CHOLECYSTECTOMY N/A 01/01/2020   Procedure: LAPAROSCOPIC CHOLECYSTECTOMY;  Surgeon: Virl Cagey, MD;  Location: [facility];  Service: General;  Laterality: N/A;   Dermoid cyst resection     WISDOM TOOTH EXTRACTION       Current Outpatient Medications  Medication Sig Dispense Refill   celecoxib (CELEBREX) 200 MG capsule Take 200 mg by mouth 2 (two) times daily as needed.     Multiple  Vitamins-Minerals (MULTIVITAMIN WITH MINERALS) tablet Take 1 tablet by mouth daily.     Polyethyl Glycol-Propyl Glycol (LUBRICANT EYE DROPS) 0.4-0.3 % SOLN Place 1 drop into both eyes 3 (three) times daily as needed (dry/irritated eyes.).     propranolol (INDERAL) 10 MG tablet Take one tab by mouth daily as needed (Patient taking differently: Take 10 mg by mouth daily as needed (Mitral valve regurgitation).) 90 tablet 0   No current facility-administered medications for this visit.    Allergies:   Milk-related compounds and Morphine and related    Social History:  The patient  reports that she has never smoked. She has never used smokeless tobacco. She reports current alcohol use. She reports that she does not use drugs.   Family History:  The patient's family history includes Heart Problems in her niece; Hypertension in her brother, brother, brother, father, and mother.    ROS:  Please see the history of present illness.   Otherwise, review of systems are positive for none.   All other systems are reviewed and negative.    PHYSICAL EXAM: VS:  BP (!) 138/98 (BP Location: Right Arm, Patient Position: Sitting, Cuff Size: Normal)   Pulse 80   Ht '5\' 9"'$  (1.753 m)   Wt 183 lb 8 oz (83.2 kg)   SpO2 99%   BMI 27.10 kg/m  , BMI  Body mass index is 27.1 kg/m. GEN: Well nourished, well developed, in no acute distress  HEENT: normal  Neck: no JVD, carotid bruits, or masses Cardiac: RRR; no murmurs, rubs, or gallops,no edema  Respiratory:  clear to auscultation bilaterally, normal work of breathing GI: soft, nontender, nondistended, + BS MS: no deformity or atrophy  Skin: warm and dry, no rash Neuro:  Strength and sensation are intact Psych: euthymic mood, full affect   EKG:  EKG is ordered today. The ekg ordered today demonstrates normal sinus rhythm with left anterior fascicular block   Recent Labs: No results found for requested labs within last 365 days.    Lipid Panel No  results found for: "CHOL", "TRIG", "HDL", "CHOLHDL", "VLDL", "LDLCALC", "LDLDIRECT"    Wt Readings from Last 3 Encounters:  08/10/22 183 lb 8 oz (83.2 kg)  07/01/21 191 lb 9.6 oz (86.9 kg)  07/05/20 190 lb (86.2 kg)           08/10/2022    8:50 AM  PAD Screen  Previous PAD dx? No  Previous surgical procedure? No  Pain with walking? No  Feet/toe relief with dangling? No  Painful, non-healing ulcers? No  Extremities discolored? No      ASSESSMENT AND PLAN:  1.  Mitral valve disease: I explained to her that trivial regurgitation is physiologic.  Given her mild exertional dyspnea, I requested a follow-up echocardiogram.  2.  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  She has propranolol to be used as needed but does not require it very often.  3.  Elevated blood pressure without history of hypertension: Likely a component of anxiety today.  She has a blood pressure machine at home and her blood pressure is usually between 254-270 systolic.    Disposition:   FU with me in 1 year  Signed,  Kathlyn Sacramento, MD  08/10/2022 9:14 AM    [place]

## 2022-09-21 ENCOUNTER — Ambulatory Visit: Payer: BC Managed Care – PPO | Attending: Cardiovascular Disease

## 2022-09-21 DIAGNOSIS — I059 Rheumatic mitral valve disease, unspecified: Secondary | ICD-10-CM | POA: Diagnosis not present

## 2022-09-21 LAB — ECHOCARDIOGRAM COMPLETE
AR max vel: 2.68 cm2
AV Area VTI: 2.4 cm2
AV Area mean vel: 2.33 cm2
AV Mean grad: 3 mmHg
AV Peak grad: 4.7 mmHg
Ao pk vel: 1.08 m/s
Area-P 1/2: 2.86 cm2
MV M vel: 4.85 m/s
MV Peak grad: 94.1 mmHg
MV VTI: 2.46 cm2
S' Lateral: 2.9 cm

## 2022-10-26 ENCOUNTER — Ambulatory Visit: Payer: Self-pay | Admitting: Dermatology

## 2022-11-23 ENCOUNTER — Encounter: Payer: Self-pay | Admitting: Radiology

## 2023-04-27 ENCOUNTER — Telehealth: Payer: Self-pay | Admitting: Cardiovascular Disease

## 2023-04-27 MED ORDER — PROPRANOLOL HCL 10 MG PO TABS: ORAL_TABLET | ORAL | 0 refills | Status: AC

## 2023-04-27 NOTE — Telephone Encounter (Signed)
Please advise If ok Propranolol last filled by Dietrich Pates, MD

## 2023-04-27 NOTE — Telephone Encounter (Signed)
     Pre-operative Risk Assessment    Patient Name: Erin Klein  DOB: [birth year] MRN: [id number]     Request for Surgical Clearance    Procedure:  Dental Extraction - Amount of Teeth to be Pulled:  1 tooth and dental cleaning   Date of Surgery:  Clearance TBD                                 Surgeon:  Dr. Beaulah Corin and Dr. Carter Kitten  Surgeon's Group or Practice Name:  [facility]  Phone number:  [phone] Fax number:  [phone]   Type of Clearance Requested:   - Medical    Type of Anesthesia:  Local  and maybe IV sedation    Additional requests/questions:  Does this patient need antibiotics?  Signed, Noe Gens   04/27/2023, 11:01 AM

## 2023-04-27 NOTE — Telephone Encounter (Signed)
*  STAT* If patient is at the pharmacy, call can be transferred to refill team.   1. Which medications need to be refilled? (please list name of each medication and dose if known) propranolol (INDERAL) 10 MG tablet   2. Which pharmacy/location (including street and city if local pharmacy) is medication to be sent to?[id number] - [place],  - [address]   3. Do they need a 30 day or 90 day supply? 90 day

## 2023-04-27 NOTE — Telephone Encounter (Signed)
Refill sent.

## 2023-04-30 NOTE — Telephone Encounter (Signed)
   Patient Name: Erin Klein  DOB: 1975/12/21 MRN: [id number]  Primary Cardiologist: Lorine Bears, MD  Chart reviewed as part of pre-operative protocol coverage.   Dental extractions of 1-2 teeth are considered low risk procedures per guidelines and generally do not require any specific cardiac clearance. It is also generally accepted that for extractions of 1-2 teeth and dental cleanings, there is no need to interrupt blood thinner therapy.  SBE prophylaxis is not required for the patient from a cardiac standpoint.  I will route this recommendation to the requesting party via Epic fax function and remove from pre-op pool.  Please call with questions.  Carlos Levering, NP 04/30/2023, 1:07 PM

## 2023-05-09 ENCOUNTER — Ambulatory Visit: Payer: Self-pay | Admitting: Dermatology

## 2023-05-10 ENCOUNTER — Encounter: Payer: Self-pay | Admitting: Dermatology

## 2023-05-10 ENCOUNTER — Ambulatory Visit: Payer: BC Managed Care – PPO | Admitting: Dermatology

## 2023-05-10 VITALS — BP 132/81 | HR 89

## 2023-05-10 DIAGNOSIS — D1801 Hemangioma of skin and subcutaneous tissue: Secondary | ICD-10-CM | POA: Diagnosis not present

## 2023-05-10 DIAGNOSIS — L821 Other seborrheic keratosis: Secondary | ICD-10-CM

## 2023-05-10 DIAGNOSIS — Z1283 Encounter for screening for malignant neoplasm of skin: Secondary | ICD-10-CM | POA: Diagnosis not present

## 2023-05-10 DIAGNOSIS — Z808 Family history of malignant neoplasm of other organs or systems: Secondary | ICD-10-CM

## 2023-05-10 DIAGNOSIS — L578 Other skin changes due to chronic exposure to nonionizing radiation: Secondary | ICD-10-CM

## 2023-05-10 DIAGNOSIS — L814 Other melanin hyperpigmentation: Secondary | ICD-10-CM | POA: Diagnosis not present

## 2023-05-10 DIAGNOSIS — D229 Melanocytic nevi, unspecified: Secondary | ICD-10-CM

## 2023-05-10 DIAGNOSIS — W908XXA Exposure to other nonionizing radiation, initial encounter: Secondary | ICD-10-CM

## 2023-05-10 NOTE — Patient Instructions (Addendum)
Can take Zyrtec (Cetirizine) daily for seasonal allergies. Can increase up to 4 times daily.   Recommend daily broad spectrum sunscreen SPF 30+ to sun-exposed areas, reapply every 2 hours as needed. Call for new or changing lesions.  Staying in the shade or wearing long sleeves, sun glasses (UVA+UVB protection) and wide brim hats (4-inch brim around the entire circumference of the hat) are also recommended for sun protection.    Seborrheic Keratosis  What causes seborrheic keratoses? Seborrheic keratoses are harmless, common skin growths that first appear during adult life.  As time goes by, more growths appear.  Some people may develop a large number of them.  Seborrheic keratoses appear on both covered and uncovered body parts.  They are not caused by sunlight.  The tendency to develop seborrheic keratoses can be inherited.  They vary in color from skin-colored to gray, brown, or even black.  They can be either smooth or have a rough, warty surface.   Seborrheic keratoses are superficial and look as if they were stuck on the skin.  Under the microscope this type of keratosis looks like layers upon layers of skin.  That is why at times the top layer may seem to fall off, but the rest of the growth remains and re-grows.    Treatment Seborrheic keratoses do not need to be treated, but can easily be removed in the office.  Seborrheic keratoses often cause symptoms when they rub on clothing or jewelry.  Lesions can be in the way of shaving.  If they become inflamed, they can cause itching, soreness, or burning.  Removal of a seborrheic keratosis can be accomplished by freezing, burning, or surgery. If any spot bleeds, scabs, or grows rapidly, please return to have it checked, as these can be an indication of a skin cancer.    Gentle Skin Care Guide  1. Bathe no more than once a day.  2. Avoid bathing in hot water  3. Use a mild soap like Dove, Vanicream, Cetaphil, CeraVe. Can use Lever 2000 or  Cetaphil antibacterial soap  4. Use soap only where you need it. On most days, use it under your arms, between your legs, and on your feet. Let the water rinse other areas unless visibly dirty.  5. When you get out of the bath/shower, use a towel to gently blot your skin dry, don't rub it.  6. While your skin is still a little damp, apply a moisturizing cream such as Vanicream, CeraVe, Cetaphil, Eucerin, Sarna lotion or plain Vaseline Jelly. For hands apply Neutrogena [place] Hand Cream or Excipial Hand Cream.  7. Reapply moisturizer any time you start to itch or feel dry.  8. Sometimes using free and clear laundry detergents can be helpful. Fabric softener sheets should be avoided. Downy Free & Gentle liquid, or any liquid fabric softener that is free of dyes and perfumes, it acceptable to use  9. If your doctor has given you prescription creams you may apply moisturizers over them      Melanoma ABCDEs  Melanoma is the most dangerous type of skin cancer, and is the leading cause of death from skin disease.  You are more likely to develop melanoma if you: Have light-colored skin, light-colored eyes, or red or blond hair Spend a lot of time in the sun Tan regularly, either outdoors or in a tanning bed Have had blistering sunburns, especially during childhood Have a close family member who has had a melanoma Have atypical moles or large birthmarks  Early detection of melanoma is key since treatment is typically straightforward and cure rates are extremely high if we catch it early.   The first sign of melanoma is often a change in a mole or a new dark spot.  The ABCDE system is a way of remembering the signs of melanoma.  A for asymmetry:  The two halves do not match. B for border:  The edges of the growth are irregular. C for color:  A mixture of colors are present instead of an even brown color. D for diameter:  Melanomas are usually (but not always) greater than 6mm - the size of  a pencil eraser. E for evolution:  The spot keeps changing in size, shape, and color.  Please check your skin once per month between visits. You can use a small mirror in front and a large mirror behind you to keep an eye on the back side or your body.   If you see any new or changing lesions before your next follow-up, please call to schedule a visit.  Please continue daily skin protection including broad spectrum sunscreen SPF 30+ to sun-exposed areas, reapplying every 2 hours as needed when you're outdoors.   Staying in the shade or wearing long sleeves, sun glasses (UVA+UVB protection) and wide brim hats (4-inch brim around the entire circumference of the hat) are also recommended for sun protection.      Due to recent changes in healthcare laws, you may see results of your pathology and/or laboratory studies on MyChart before the doctors have had a chance to review them. We understand that in some cases there may be results that are confusing or concerning to you. Please understand that not all results are received at the same time and often the doctors may need to interpret multiple results in order to provide you with the best plan of care or course of treatment. Therefore, we ask that you please give [place] 2 business days to thoroughly review all your results before contacting the office for clarification. Should we see a critical lab result, you will be contacted sooner.   If You Need Anything After Your Visit  If you have any questions or concerns for your doctor, please call our main line at [phone] and press option 4 to reach your doctor's medical assistant. If no one answers, please leave a voicemail as directed and we will return your call as soon as possible. Messages left after 4 pm will be answered the following business day.   You may also send [place] a message via MyChart. We typically respond to MyChart messages within 1-2 business days.  For prescription refills, please ask  your pharmacy to contact our office. Our fax number is [phone].  If you have an urgent issue when the clinic is closed that cannot wait until the next business day, you can page your doctor at the number below.    Please note that while we do our best to be available for urgent issues outside of office hours, we are not available 24/7.   If you have an urgent issue and are unable to reach [place], you may choose to seek medical care at your doctor's office, retail clinic, urgent care center, or emergency room.  If you have a medical emergency, please immediately call 911 or go to the emergency department.  Pager Numbers  - Dr. Gwen Pounds: [phone]  - Dr. Roseanne Reno: [phone]  - Dr. Katrinka Blazing: [phone]   In the event of inclement weather,  please call our main line at [phone] for an update on the status of any delays or closures.  Dermatology Medication Tips: Please keep the boxes that topical medications come in in order to help keep track of the instructions about where and how to use these. Pharmacies typically print the medication instructions only on the boxes and not directly on the medication tubes.   If your medication is too expensive, please contact our office at [phone] option 4 or send [place] a message through MyChart.   We are unable to tell what your co-pay for medications will be in advance as this is different depending on your insurance coverage. However, we may be able to find a substitute medication at lower cost or fill out paperwork to get insurance to cover a needed medication.   If a prior authorization is required to get your medication covered by your insurance company, please allow [place] 1-2 business days to complete this process.  Drug prices often vary depending on where the prescription is filled and some pharmacies may offer cheaper prices.  The website www.goodrx.com contains coupons for medications through different pharmacies. The prices here do not  account for what the cost may be with help from insurance (it may be cheaper with your insurance), but the website can give you the price if you did not use any insurance.  - You can print the associated coupon and take it with your prescription to the pharmacy.  - You may also stop by our office during regular business hours and pick up a GoodRx coupon card.  - If you need your prescription sent electronically to a different pharmacy, notify our office through [facility] or by phone at [phone] option 4.     Si Usted Necesita Algo Despus de Su Visita  Tambin puede enviarnos un mensaje a travs de Clinical cytogeneticist. Por lo general respondemos a los mensajes de MyChart en el transcurso de 1 a 2 das hbiles.  Para renovar recetas, por favor pida a su farmacia que se ponga en contacto con nuestra oficina. Annie Sable de fax es [place] [phone].  Si tiene un asunto urgente cuando la clnica est cerrada y que no puede esperar hasta el siguiente da hbil, puede llamar/localizar a su doctor(a) al nmero que aparece a continuacin.   Por favor, tenga en cuenta que aunque hacemos todo lo posible para estar disponibles para asuntos urgentes fuera del horario de [place], no estamos disponibles las 24 horas del da, los 7 [address]  [address] de la [place].   Si tiene un problema urgente y no puede comunicarse con nosotros, puede optar por buscar atencin mdica  en el consultorio de su doctor(a), en una clnica privada, en un centro de atencin urgente o en una sala de emergencias.  Si tiene Engineer, drilling, por favor llame inmediatamente al 911 o vaya a la sala de emergencias.  Nmeros de bper  - Dr. Gwen Pounds: [phone]  - Dra. Roseanne Reno: [phone]  - Dr. Katrinka Blazing: [phone]   En caso de inclemencias del tiempo, por favor llame a Lacy Duverney principal al [phone] para una actualizacin sobre el [place] de cualquier retraso o cierre.  Consejos para la medicacin en dermatologa: Por  favor, guarde las cajas en las que vienen los medicamentos de uso tpico para ayudarle a seguir las instrucciones sobre dnde y cmo usarlos. Las farmacias generalmente imprimen las instrucciones del medicamento slo en las cajas y no directamente en los tubos del [place].   Si su medicamento es [employer]  caro, por favor, pngase en contacto con nuestra oficina llamando al [phone] y presione la opcin 4 o envenos un mensaje a travs de Clinical cytogeneticist.   No podemos decirle cul ser su copago por los medicamentos por adelantado ya que esto es diferente dependiendo de la cobertura de su seguro. Sin embargo, es posible que podamos encontrar un medicamento sustituto a Audiological scientist un formulario para que el seguro cubra el medicamento que se considera necesario.   Si se requiere una autorizacin previa para que su compaa de seguros [place] su medicamento, por favor permtanos de 1 a 2 das hbiles para completar [address]  Los precios de los medicamentos varan con frecuencia dependiendo del Environmental consultant de dnde se surte la receta y alguna farmacias pueden ofrecer precios ms baratos.  El sitio web www.goodrx.com tiene cupones para medicamentos de Health and safety inspector. Los precios aqu no tienen en cuenta lo que podra costar con la ayuda del seguro (puede ser ms barato con su seguro), pero el sitio web puede darle el precio si no utiliz Tourist information centre manager.  - Puede imprimir el cupn correspondiente y llevarlo con su receta a la farmacia.  - Tambin puede pasar por nuestra oficina durante el horario de atencin regular y Education officer, museum una tarjeta de cupones de GoodRx.  - Si necesita que su receta se enve electrnicamente a una farmacia diferente, informe a nuestra oficina a travs de MyChart de [place] o por telfono llamando al [phone] y presione la opcin 4.

## 2023-05-10 NOTE — Progress Notes (Signed)
   New Patient Visit   Subjective  Erin Klein is a 47 y.o. female who presents for the following: Skin Cancer Screening and Full Body Skin Exam. No personal hx of skin cancer. Hx of atypical mole at left axilla.   Family Hx of skin cancer in grandparents.   The patient presents for Total-Body Skin Exam (TBSE) for skin cancer screening and mole check. The patient has spots, moles and lesions to be evaluated, some may be new or changing and the patient may have concern these could be cancer.    The following portions of the chart were reviewed this encounter and updated as appropriate: medications, allergies, medical history  Review of Systems:  No other skin or systemic complaints except as noted in HPI or Assessment and Plan.  Objective  Well appearing patient in no apparent distress; mood and affect are within normal limits.  A full examination was performed including scalp, head, eyes, ears, nose, lips, neck, chest, axillae, abdomen, back, buttocks, bilateral upper extremities, bilateral lower extremities, hands, feet, fingers, toes, fingernails, and toenails. All findings within normal limits unless otherwise noted below.   Relevant physical exam findings are noted in the Assessment and Plan.  Exam of nails limited by presence of nail polish.   Assessment & Plan   SKIN CANCER SCREENING PERFORMED TODAY.  ACTINIC DAMAGE - Chronic condition, secondary to cumulative UV/sun exposure - diffuse scaly erythematous macules with underlying dyspigmentation - Recommend daily broad spectrum sunscreen SPF 30+ to sun-exposed areas, reapply every 2 hours as needed.  - Staying in the shade or wearing long sleeves, sun glasses (UVA+UVB protection) and wide brim hats (4-inch brim around the entire circumference of the hat) are also recommended for sun protection.  - Call for new or changing lesions.  LENTIGINES, SEBORRHEIC KERATOSES, HEMANGIOMAS - Benign normal skin lesions -  Benign-appearing - Call for any changes  MELANOCYTIC NEVI - Tan-brown and/or pink-flesh-colored symmetric macules and papules - Benign appearing on exam today - Observation - Call clinic for new or changing moles - Recommend daily use of broad spectrum spf 30+ sunscreen to sun-exposed areas.     SEBORRHEIC KERATOSES - Stuck-on, waxy, tan-brown papules and/or plaques at B/L bra lines - Benign-appearing - Discussed benign etiology and prognosis. - Observe - Call for any changes    Return in about 1 year (around 05/09/2024) for TBSE.  I, Lawson Radar, CMA, am acting as scribe for Elie Goody, MD.   Documentation: I have reviewed the above documentation for accuracy and completeness, and I agree with the above.  Elie Goody, MD

## 2023-08-02 ENCOUNTER — Other Ambulatory Visit: Payer: Self-pay

## 2023-08-02 ENCOUNTER — Encounter: Payer: Self-pay | Admitting: Neurology

## 2023-08-02 DIAGNOSIS — R202 Paresthesia of skin: Secondary | ICD-10-CM

## 2023-09-17 ENCOUNTER — Ambulatory Visit: Payer: BC Managed Care – PPO | Admitting: Neurology

## 2023-09-17 DIAGNOSIS — R202 Paresthesia of skin: Secondary | ICD-10-CM | POA: Diagnosis not present

## 2023-09-17 DIAGNOSIS — G5603 Carpal tunnel syndrome, bilateral upper limbs: Secondary | ICD-10-CM

## 2023-09-17 NOTE — Procedures (Signed)
[facility]  [address] [place], [address]  [place], [place] [address] Tel: [phone] Fax: [phone] Test Date:  09/17/2023  Patient: Erin Klein DOB: 1976-06-14 Physician: Jacquelyne Balint, MD  Sex: Female Height: 5\' 9"  Ref Phys: Terie Purser, PA-C  ID#: [id number]   Technician:    History: This is a 46 year old female with hand numbness and pain.  NCV & EMG Findings: Extensive electrodiagnostic evaluation of the right and left upper limbs shows: Right median-ulnar palmar sensory response shows prolonged distal peak latency (Median Palm-Wrist, 2.3 ms) and abnormal peak latency difference ((Median Palm-Wrist)-(Ulnar Palm-Wrist), 0.67 ms). Left median-ulnar palmar sensory response shows abnormal peak latency difference ((Median Palm-Wrist)-(Ulnar Palm-Wrist), 0.52 ms). Bilateral median, ulnar, and left radial sensory responses are within normal limits. Bilateral median (APB) and ulnar (ADM) motor responses are within normal limits. There is no evidence of active or chronic motor axon loss changes affecting any of the tested muscles on needle examination. Motor unit configuration and recruitment pattern is within normal limits.  Impression: This is an abnormal study. The findings are most consistent with the following: Evidence of bilateral median mononeuropathy at or distal to the wrist, consistent with carpal tunnel syndrome, very mild in degree electrically, right worse than left. No electrodiagnostic evidence of a right or left cervical (C5-C8) motor radiculopathy.    ___________________________ Jacquelyne Balint, MD    Nerve Conduction Studies Motor Nerve Results    Latency Amplitude F-Lat Segment Distance CV Comment  Site (ms) Norm (mV) Norm (ms)  (cm) (m/s) Norm   Left Median (APB) Motor  Wrist 2.7  < 3.9 9.6  > 6.0        Elbow 7.0 - 9.4 -  Elbow-Wrist 26 60  > 50   Right Median (APB) Motor  Wrist 2.5  < 3.9 9.5  > 6.0        Elbow 7.2 - 9.3 -  Elbow-Wrist  29 62  > 50   Left Ulnar (ADM) Motor  Wrist 1.78  < 3.1 11.8  > 7.0        Bel elbow 4.7 - 11.0 -  Bel elbow-Wrist 19 66  > 50   Ab elbow 6.3 - 11.1 -  Ab elbow-Bel elbow 10 63 -   Right Ulnar (ADM) Motor  Wrist 1.43  < 3.1 12.4  > 7.0        Bel elbow 4.5 - 12.1 -  Bel elbow-Wrist 19.5 63  > 50   Ab elbow 6.3 - 12.0 -  Ab elbow-Bel elbow 10 56 -    Sensory Sites    Neg Peak Lat Amplitude (O-P) Segment Distance Velocity Comment  Site (ms) Norm (V) Norm  (cm) (ms)   Left Median Sensory  Wrist-Dig II 3.1  < 3.4 37  > 20 Wrist-Dig II 13    Right Median Sensory  Wrist-Dig II 3.2  < 3.4 29  > 20 Wrist-Dig II 13    Left Median-Ulnar Palmar Sensory       Median  Palm-Wrist 2.1  < 2.2 118  > 10 Palm-Wrist 8         Ulnar  Palm-Wrist 1.58  < 2.2 44  > 5 Palm-Wrist 8    Right Median-Ulnar Palmar Sensory       Median  Palm-Wrist *2.3  < 2.2 124  > 10 Palm-Wrist 8         Ulnar  Palm-Wrist 1.63  < 2.2 45  > 5 Palm-Wrist 8  Left Radial Sensory  Forearm-Wrist 2.0  < 2.7 41  > 18 Forearm-Wrist 10    Left Ulnar Sensory  Wrist-Dig V 2.5  < 3.1 33  > 12 Wrist-Dig V 11    Right Ulnar Sensory  Wrist-Dig V 2.5  < 3.1 35  > 12 Wrist-Dig V 11     Inter-Nerve Comparisons   Nerve 1 Value 1 Nerve 2 Value 2 Parameter Result Normal  Sensory Sites  R Median Palm-Wrist 2.3 ms R Ulnar Palm-Wrist 1.63 ms Peak Lat Diff *0.67 ms <0.40  L Median Palm-Wrist 2.1 ms L Ulnar Palm-Wrist 1.58 ms Peak Lat Diff *0.52 ms <0.40   Electromyography   Side Muscle Ins.Act Fibs Fasc Recrt Amp Dur Poly Activation Comment  Right FDI Nml Nml Nml Nml Nml Nml Nml Nml N/A  Right EIP Nml Nml Nml Nml Nml Nml Nml Nml N/A  Right Pronator teres Nml Nml Nml Nml Nml Nml Nml Nml N/A  Right Biceps Nml Nml Nml Nml Nml Nml Nml Nml N/A  Right Triceps Nml Nml Nml Nml Nml Nml Nml Nml N/A  Right Deltoid Nml Nml Nml Nml Nml Nml Nml Nml N/A  Left FDI Nml Nml Nml Nml Nml Nml Nml Nml N/A  Left EIP Nml Nml Nml Nml Nml Nml Nml Nml N/A   Left Pronator teres Nml Nml Nml Nml Nml Nml Nml Nml N/A  Left Biceps Nml Nml Nml Nml Nml Nml Nml Nml N/A  Left Triceps Nml Nml Nml Nml Nml Nml Nml Nml N/A  Left Deltoid Nml Nml Nml Nml Nml Nml Nml Nml N/A      Waveforms:  Motor           Sensory

## 2024-05-05 ENCOUNTER — Encounter: Payer: BC Managed Care – PPO | Admitting: Dermatology

## 2024-05-07 ENCOUNTER — Encounter: Payer: Self-pay | Admitting: *Deleted

## 2024-05-08 ENCOUNTER — Encounter: Payer: Self-pay | Admitting: Cardiovascular Disease

## 2024-05-08 ENCOUNTER — Ambulatory Visit: Payer: Self-pay | Attending: Cardiovascular Disease | Admitting: Cardiovascular Disease

## 2024-05-08 VITALS — BP 118/80 | HR 75 | Ht 69.0 in | Wt 190.0 lb

## 2024-05-08 DIAGNOSIS — E785 Hyperlipidemia, unspecified: Secondary | ICD-10-CM | POA: Diagnosis not present

## 2024-05-08 DIAGNOSIS — I493 Ventricular premature depolarization: Secondary | ICD-10-CM

## 2024-05-08 DIAGNOSIS — I059 Rheumatic mitral valve disease, unspecified: Secondary | ICD-10-CM

## 2024-05-08 NOTE — Progress Notes (Signed)
 Cardiology Office Note   Date:  05/09/2024   ID:  Erin Klein, DOB 1976-06-13, MRN [id number]  PCP:  Bertell Satterfield, MD  Cardiologist:   Deatrice Cage, MD   Chief Complaint  Patient presents with   Follow-up    Mitral valve disease, patient complains of fatigue and shortness of breath (weakened) with exertion.        History of Present Illness: Erin Klein is a 48 y.o. female who is here for a follow-up visit for palpitations.  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  She works as a Social research officer, government. 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  She was seen by Dr. Okey in 2019 and by [facility] cardiology in 2022.  She was seen by me in 2023 for mild palpitations and shortness of breath. She had an echocardiogram done which showed normal LV systolic function with only trivial mitral regurgitation.  She has been doing very well overall with minimal palpitations.  No chest pain or shortness of breath.  She is concerned about hyperlipidemia.  Past Medical History:  Diagnosis Date   Contact dermatitis due to chemicals 09/12/2017   Dermoid cyst    Removed   Epistaxis 09/12/2017   Mitral regurgitation    Mild   Palpitations    Rare PVCs by cardiac monitor    Past Surgical History:  Procedure Laterality Date   CHOLECYSTECTOMY N/A 01/01/2020   Procedure: LAPAROSCOPIC CHOLECYSTECTOMY;  Surgeon: Kallie Manuelita BROCKS, MD;  Location: [facility];  Service: General;  Laterality: N/A;   Dermoid cyst resection     WISDOM TOOTH EXTRACTION       Current Outpatient Medications  Medication Sig Dispense Refill   celecoxib (CELEBREX) 200 MG capsule Take 200 mg by mouth 2 (two) times daily as needed.     Multiple Vitamins-Minerals (MULTIVITAMIN WITH MINERALS) tablet Take 1 tablet by mouth daily.     Polyethyl Glycol-Propyl Glycol (LUBRICANT EYE  DROPS) 0.4-0.3 % SOLN Place 1 drop into both eyes 3 (three) times daily as needed (dry/irritated eyes.).     propranolol (INDERAL) 10 MG tablet Take one tab by mouth daily as needed 90 tablet 0   No current facility-administered medications for this visit.    Allergies:   Milk-related compounds and Morphine and codeine    Social History:  The patient  reports that she has never smoked. She has never used smokeless tobacco. She reports current alcohol use. She reports that she does not use drugs.   Family History:  The patient's family history includes Heart Problems in her niece; Hypertension in her brother, brother, brother, father, and mother.    ROS:  Please see the history of present illness.   Otherwise, review of systems are positive for none.   All other systems are reviewed and negative.    PHYSICAL EXAM: VS:  BP 118/80 (BP Location: Left Arm, Patient Position: Sitting, Cuff Size: Normal)   Pulse 75   Ht 5' 9 (1.753 m)   Wt 190 lb (86.2 kg)   SpO2 98%   BMI 28.06 kg/m  , BMI Body mass index is 28.06 kg/m. GEN: Well nourished, well developed, in no acute distress  HEENT: normal  Neck: no JVD, carotid bruits, or masses Cardiac: RRR; no murmurs, rubs, or gallops,no edema  Respiratory:  clear to auscultation bilaterally, normal work of breathing GI: soft, nontender, nondistended, +  BS MS: no deformity or atrophy  Skin: warm and dry, no rash Neuro:  Strength and sensation are intact Psych: euthymic mood, full affect   EKG:  EKG is ordered today. The ekg ordered today demonstrates: Sinus rhythm with Premature supraventricular complexes Left anterior fascicular block When compared with ECG of 05-Jul-2020 15:58, Premature supraventricular complexes are now Present    Recent Labs: No results found for requested labs within last 365 days.    Lipid Panel No results found for: CHOL, TRIG, HDL, CHOLHDL, VLDL, LDLCALC, LDLDIRECT    Wt Readings from Last  3 Encounters:  05/08/24 190 lb (86.2 kg)  08/10/22 183 lb 8 oz (83.2 kg)  07/01/21 191 lb 9.6 oz (86.9 kg)           08/10/2022    8:50 AM  PAD Screen  Previous PAD dx? No  Previous surgical procedure? No  Pain with walking? No  Feet/toe relief with dangling? No  Painful, non-healing ulcers? No  Extremities discolored? No      ASSESSMENT AND PLAN:  1.  Palpitations: Due to mild PVCs and PACs.  She has propranolol to be used as needed but has not required the medication.  Most recent echocardiogram in 2023 was unremarkable.  2.  Hyperlipidemia: Most recent lipid profile showed an LDL of 123.  She wants to know her cardiovascular risk.  I recommended CT calcium score for risk stratification and she is agreeable.  This will help us  determine the need for treatment or not.    Disposition:   FU with me in 1 year  Signed,  Deatrice Cage, MD  05/09/2024 8:14 AM    [facility]

## 2024-05-08 NOTE — Patient Instructions (Signed)
 Medication Instructions:  No changes *If you need a refill on your cardiac medications before your next appointment, please call your pharmacy*  Lab Work: None ordered If you have labs (blood work) drawn today and your tests are completely normal, you will receive your results only by: MyChart Message (if you have MyChart) OR A paper copy in the mail If you have any lab test that is abnormal or we need to change your treatment, we will call you to review the results.  Testing/Procedures: Your physician has recommended that you have CT Coronary Calcium Score.  - $99 out of pocket cost at the time of your test - Call [phone] to schedule at your convenience.  [facility] [address] [place], [place] [address]   Coronary CalciumScan A coronary calcium scan is an imaging test used to look for deposits of calcium and other fatty materials (plaques) in the inner lining of the blood vessels of the heart (coronary arteries). These deposits of calcium and plaques can partly clog and narrow the coronary arteries without producing any symptoms or warning signs. This puts a person at risk for a heart attack. This test can detect these deposits before symptoms develop. Tell a health care provider about: Any allergies you have. All medicines you are taking, including vitamins, herbs, eye drops, creams, and over-the-counter medicines. Any problems you or family members have had with anesthetic medicines. Any blood disorders you have. Any surgeries you have had. Any medical conditions you have. Whether you are pregnant or may be pregnant. What are the risks? Generally, this is a safe procedure. However, problems may occur, including: Harm to a pregnant woman and her unborn baby. This test involves the use of radiation. Radiation exposure can be dangerous to a pregnant woman and her unborn baby. If you are pregnant, you generally should not have this  procedure done. Slight increase in the risk of cancer. This is because of the radiation involved in the test. What happens before the procedure? No preparation is needed for this procedure. What happens during the procedure? You will undress and remove any jewelry around your neck or chest. You will put on a hospital gown. Sticky electrodes will be placed on your chest. The electrodes will be connected to an electrocardiogram (ECG) machine to record a tracing of the electrical activity of your heart. A CT scanner will take pictures of your heart. During this time, you will be asked to lie still and hold your breath for 2-3 seconds while a picture of your heart is being taken. The procedure may vary among health care providers and hospitals. What happens after the procedure? You can get dressed. You can return to your normal activities. It is up to you to get the results of your test. Ask your health care provider, or the department that is doing the test, when your results will be ready. Summary A coronary calcium scan is an imaging test used to look for deposits of calcium and other fatty materials (plaques) in the inner lining of the blood vessels of the heart (coronary arteries). Generally, this is a safe procedure. Tell your health care provider if you are pregnant or may be pregnant. No preparation is needed for this procedure. A CT scanner will take pictures of your heart. You can return to your normal activities after the scan is done. This information is not intended to replace advice given to you by your health care provider. Make sure you  discuss any questions you have with your health care provider. Document Released: 03/09/2008 Document Revised: 07/31/2016 Document Reviewed: 07/31/2016 Elsevier Interactive Patient Education  2017 [employer].   Follow-Up: At [facility], you and your health needs are our priority.  As part of our continuing mission to provide you  with exceptional heart care, our providers are all part of one team.  This team includes your primary Cardiologist (physician) and Advanced Practice Providers or APPs (Physician Assistants and Nurse Practitioners) who all work together to provide you with the care you need, when you need it.  Your next appointment:   12 month(s)  Provider:   You may see Deatrice Cage, MD or one of the following Advanced Practice Providers on your designated Care Team:   Lonni Meager, NP Lesley Maffucci, PA-C Bernardino Bring, PA-C Cadence Oakland, PA-C Tylene Lunch, NP Barnie Hila, NP    We recommend signing up for the patient portal called MyChart.  Sign up information is provided on this After Visit Summary.  MyChart is used to connect with patients for Virtual Visits (Telemedicine).  Patients are able to view lab/test results, encounter notes, upcoming appointments, etc.  Non-urgent messages can be sent to your provider as well.   To learn more about what you can do with MyChart, go to [email or web address].

## 2024-05-12 ENCOUNTER — Encounter: Payer: BC Managed Care – PPO | Admitting: Dermatology

## 2024-05-19 ENCOUNTER — Ambulatory Visit
Admission: RE | Admit: 2024-05-19 | Discharge: 2024-05-19 | Disposition: A | Discharge status: Home / Self Care | Payer: Self-pay | Source: Ambulatory Visit | Attending: Cardiovascular Disease | Admitting: Cardiovascular Disease

## 2024-05-19 DIAGNOSIS — E785 Hyperlipidemia, unspecified: Secondary | ICD-10-CM | POA: Insufficient documentation

## 2024-05-22 ENCOUNTER — Ambulatory Visit: Payer: Self-pay | Admitting: Cardiovascular Disease

## 2024-06-18 ENCOUNTER — Ambulatory Visit: Payer: Self-pay | Admitting: Cardiovascular Disease
